# Patient Record
Sex: Male | Born: 1980 | Race: White | Hispanic: No | Marital: Single | Smoking: Never smoker
Health system: Southern US, Community
[De-identification: ages and names within clinical notes are randomized; demographics above are authoritative.]

## PROBLEM LIST (undated history)

## (undated) DIAGNOSIS — F32A Depression, unspecified: Secondary | ICD-10-CM

## (undated) DIAGNOSIS — F419 Anxiety disorder, unspecified: Secondary | ICD-10-CM

## (undated) DIAGNOSIS — F329 Major depressive disorder, single episode, unspecified: Secondary | ICD-10-CM

---

## 2013-06-06 ENCOUNTER — Emergency Department (HOSPITAL_COMMUNITY)
Admission: EM | Admit: 2013-06-06 | Discharge: 2013-06-06 | Disposition: A | Discharge status: Home / Self Care | Payer: Self-pay | Attending: Emergency Medicine | Admitting: Emergency Medicine

## 2013-06-06 ENCOUNTER — Encounter (HOSPITAL_COMMUNITY): Payer: Self-pay | Admitting: Emergency Medicine

## 2013-06-06 DIAGNOSIS — R110 Nausea: Secondary | ICD-10-CM | POA: Insufficient documentation

## 2013-06-06 DIAGNOSIS — F329 Major depressive disorder, single episode, unspecified: Secondary | ICD-10-CM

## 2013-06-06 DIAGNOSIS — F3289 Other specified depressive episodes: Secondary | ICD-10-CM | POA: Insufficient documentation

## 2013-06-06 DIAGNOSIS — M79609 Pain in unspecified limb: Secondary | ICD-10-CM | POA: Insufficient documentation

## 2013-06-06 DIAGNOSIS — F411 Generalized anxiety disorder: Secondary | ICD-10-CM

## 2013-06-06 DIAGNOSIS — Z79899 Other long term (current) drug therapy: Secondary | ICD-10-CM | POA: Insufficient documentation

## 2013-06-06 LAB — CBC WITH DIFFERENTIAL/PLATELET
Basophils Absolute: 0 10*3/uL (ref 0.0–0.1)
Basophils Relative: 0 % (ref 0–1)
MCHC: 35.1 g/dL (ref 30.0–36.0)
Monocytes Absolute: 0.5 10*3/uL (ref 0.1–1.0)
Neutro Abs: 5.7 10*3/uL (ref 1.7–7.7)
Neutrophils Relative %: 74 % (ref 43–77)
Platelets: 216 10*3/uL (ref 150–400)
RDW: 12.8 % (ref 11.5–15.5)
WBC: 7.7 10*3/uL (ref 4.0–10.5)

## 2013-06-06 LAB — COMPREHENSIVE METABOLIC PANEL
ALT: 15 U/L (ref 0–53)
AST: 13 U/L (ref 0–37)
Albumin: 4.5 g/dL (ref 3.5–5.2)
Chloride: 102 mEq/L (ref 96–112)
Creatinine, Ser: 0.92 mg/dL (ref 0.50–1.35)
Potassium: 3.9 mEq/L (ref 3.5–5.1)
Sodium: 139 mEq/L (ref 135–145)
Total Bilirubin: 0.5 mg/dL (ref 0.3–1.2)

## 2013-06-06 LAB — RAPID URINE DRUG SCREEN, HOSP PERFORMED
Amphetamines: NOT DETECTED
Opiates: NOT DETECTED
Tetrahydrocannabinol: NOT DETECTED

## 2013-06-06 MED ORDER — HYDROXYZINE HCL 25 MG PO TABS: 25.0000 mg | ORAL_TABLET | Freq: Three times a day (TID) | ORAL | Status: DC | PRN

## 2013-06-06 NOTE — ED Notes (Signed)
Call has been made to [facility] for consult. 

## 2013-06-06 NOTE — Consult Note (Signed)
Telepsych Consultation   Reason for Consult:  Anxiety Referring Physician:  [name] is an 32 y.o. male.  Assessment: AXIS I:  Depressive Disorder NOS, Generalized Anxiety Disorder and R/O Asperger Disorder AXIS II:  Deferred AXIS III:  History reviewed. No pertinent past medical history. AXIS IV:  problems related to social environment AXIS V:  51-60 moderate symptoms  Plan:  No evidence of imminent risk to self or others at present.   Patient does not meet criteria for psychiatric inpatient admission. Discussed crisis plan, support from social network, calling 911, coming to the Emergency Department, and calling Suicide Hotline.  Subjective: I am really anxious  HPI:   Andrew Howe is a 32 y.o. male patient to the Emergency Department complaining of anxiety. 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 He states that he was seen by his PCP last week and was started on Lexapro 10 MG for a week, then 20 MG daily. Patient has been taking for the past 4 to 5 days.  He reports having intermittent episodes of Anxiety and has been treated with Paxil, Wellbutrin and Zoloft in the past. He was last on Wellbutrin before being switched to Lexapro as his anxiety had gotten worse. PCP wanted the pt to be evaluated today to set him up for pschyiatric help.  He admits to SI occasionally but denies SI or specific plan. Last time he had SI was 4 to 5 days ago. He states that he lives with his parents and has access to a gun but Mom denies this. She states that there are no guns at home. He denies HI. He admits that he has been eating, drinking and sleeping less lately due to increased anxiety and depression over thinking about other people's lives and how "they might be better than mine".  He denies alcohol or illegal drug use. He also states he struggles socially, does not like changes and feels he has Asperger  Disorder. Mom agrees with patient and feels he needs an assessment for Autism.   Past Psychiatric History: History reviewed. No pertinent past medical history.  has no tobacco, alcohol, and drug history on file. History reviewed. No pertinent family history.       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 rate 14, weight 219 lb (99.338 kg), SpO2 100.00%.There is no height on file to calculate BMI. Results for orders placed during the hospital encounter of 06/06/13 (from the past 72 hour(s))  CBC WITH DIFFERENTIAL     Status: None   Collection Time    06/06/13  9:38 AM      Result Value Range   WBC 7.7  4.0 -  10.5 K/uL   RBC 5.37  4.22 - 5.81 MIL/uL   Hemoglobin 15.6  13.0 - 17.0 g/dL   HCT 16.1  09.6 - 04.5 %   MCV 82.7  78.0 - 100.0 fL   MCH 29.1  26.0 - 34.0 pg   MCHC 35.1  30.0 - 36.0 g/dL   RDW 40.9  81.1 - 91.4 %   Platelets 216  150 - 400 K/uL   Neutrophils Relative % 74  43 - 77 %   Neutro Abs 5.7  1.7 - 7.7 K/uL   Lymphocytes Relative 19  12 - 46 %   Lymphs Abs 1.5  0.7 - 4.0 K/uL   Monocytes Relative 6  3 - 12 %   Monocytes Absolute 0.5  0.1 - 1.0 K/uL   Eosinophils Relative 1  0 - 5 %   Eosinophils Absolute 0.1  0.0 - 0.7 K/uL   Basophils  Relative 0  0 - 1 %   Basophils Absolute 0.0  0.0 - 0.1 K/uL  COMPREHENSIVE METABOLIC PANEL     Status: Abnormal   Collection Time    06/06/13  9:38 AM      Result Value Range   Sodium 139  135 - 145 mEq/L   Potassium 3.9  3.5 - 5.1 mEq/L   Chloride 102  96 - 112 mEq/L   CO2 26  19 - 32 mEq/L   Glucose, Bld 112 (*) 70 - 99 mg/dL   BUN 14  6 - 23 mg/dL   Creatinine, Ser 7.82  0.50 - 1.35 mg/dL   Calcium 9.4  8.4 - 95.6 mg/dL   Total Protein 7.9  6.0 - 8.3 g/dL   Albumin 4.5  3.5 - 5.2 g/dL   AST 13  0 - 37 U/L   ALT 15  0 - 53 U/L   Alkaline Phosphatase 56  39 - 117 U/L   Total Bilirubin 0.5  0.3 - 1.2 mg/dL   GFR calc non Af Amer >90  >90 mL/min   GFR calc Af Amer >90  >90 mL/min   Comment:            The eGFR has been calculated     using the CKD EPI equation.     This calculation has not been     validated in all clinical     situations.     eGFR's persistently     <90 mL/min signify     possible Chronic Kidney Disease.  ETHANOL     Status: None   Collection Time    06/06/13  9:38 AM      Result Value Range   Alcohol, Ethyl (B) <11  0 - 11 mg/dL   Comment:            LOWEST DETECTABLE LIMIT FOR     SERUM ALCOHOL IS 11 mg/dL     FOR MEDICAL PURPOSES ONLY  URINE RAPID DRUG SCREEN (HOSP PERFORMED)     Status: None   Collection Time    06/06/13  9:56 AM      Result Value Range   Opiates NONE DETECTED  NONE DETECTED   Cocaine NONE DETECTED  NONE DETECTED   Benzodiazepines NONE DETECTED  NONE DETECTED   Amphetamines NONE DETECTED  NONE DETECTED   Tetrahydrocannabinol NONE DETECTED  NONE DETECTED   Barbiturates NONE DETECTED  NONE DETECTED   Comment:            DRUG SCREEN FOR MEDICAL PURPOSES  ONLY.  IF CONFIRMATION IS NEEDED     FOR ANY PURPOSE, NOTIFY LAB     WITHIN 5 DAYS.                LOWEST DETECTABLE LIMITS     FOR URINE DRUG SCREEN     Drug Class       Cutoff (ng/mL)     Amphetamine      1000     Barbiturate      200     Benzodiazepine   200      Tricyclics       300     Opiates          300     Cocaine          300     THC              50   Labs are reviewed and are negative  No current facility-administered medications for this encounter.   Current Outpatient Prescriptions  Medication Sig Dispense Refill  . escitalopram (LEXAPRO) 20 MG tablet Take 20 mg by mouth daily.      . naproxen sodium (ALEVE) 220 MG tablet Take 220 mg by mouth 2 (two) times daily as needed (Pain).        Psychiatric Specialty Exam:     Blood pressure 164/100, pulse 89, temperature 98.1 F (36.7 C), temperature source Oral, resp. rate 14, weight 219 lb (99.338 kg), SpO2 100.00%.There is no height on file to calculate BMI.  General Appearance: Casual and Fairly Groomed  Patent attorney::  Fair  Speech:  Clear and Coherent and Normal Rate  Volume:  Normal  Mood:  Anxious  Affect:  Congruent and Depressed  Thought Process:  Goal Directed and Intact  Orientation:  Full (Time, Place, and Person)  Thought Content:  WDL and Rumination  Suicidal Thoughts:  No  Homicidal Thoughts:  No  Memory:  Immediate;   Fair Recent;   Fair Remote;   Fair  Judgement:  Intact  Insight:  Shallow  Psychomotor Activity:  Normal  Concentration:  Fair  Recall:  Fair  Akathisia:  No  Handed:  Right  AIMS (if indicated):     Assets:  Housing Physical Health Social Support  Sleep:       Disposition:Patient discharged home in the care of mother. Patient to make an appointment with [facility] Increase Lexapro to 20 MG daily from today to help with anxiety Start Vistaril 25 MG PO 1 TID PRN anxiety. Discussed this with patient and Mom Discussed with patient a safety plan if patient has suicidal thoughts again, none currently, last time was 4 to 5 days ago. Mom also involved in the discussion of this plan and states she can monitor patient closely, also denies any safety concerns.     Murline Weigel 06/06/2013 2:30 PM

## 2013-06-06 NOTE — ED Notes (Signed)
PER EDP pt states he does have thoughts of suicide.

## 2013-06-06 NOTE — ED Provider Notes (Addendum)
CSN: [id number]     Arrival date & time 06/06/13  0912 History  This chart was scribed for Glynn Octave, MD by Bennett Scrape, ED Scribe. This patient was seen in room APA15/APA15 and the patient's care was started at 9:27 AM.   First MD Initiated Contact with Patient 06/06/13 0919     Chief Complaint  Patient presents with  . Anxiety    The history is provided by the patient. No language interpreter was used.    HPI Comments: Andrew Howe is a 32 y.o. male who presents to the Emergency Department complaining of anxiety. 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 He states that he was seen by his PCP last week and was started on Lexapro which he has been taking for the past 4 to 5 days. He reports having intermittent episodes of Anxiety and has been treated with Paxil, Wellbutrin and Zoloft in the past. He was last on Wellbutrin before being switched to Lexapro. PCP wanted the pt to be evaluated today to set him up for pschyiatric help. He admits to SI occasionally but denies SI or specific plan. Last time he had SI was 2 to 3 days ago. He states that he lives with his parents and has access to a gun. He denies HI. He admits that he has been eating, drinking and sleeping less lately due to increased anxiety and depression over thinking about other people's lives and how "they might be better than mine". He reports chronic nausea,"hot flashes" and bilateral arm pain described as tightness. He denies alcohol or illegal drug use.   [facility] is PCP  History reviewed. No pertinent past medical history. History reviewed. No pertinent past surgical history. History reviewed. No pertinent family history. History  Substance Use Topics  . Smoking status: Not on file  . Smokeless tobacco: Not on file  . Alcohol Use: Not on file    Review of Systems  A complete 10 system review of systems was obtained and all  systems are negative except as noted in the HPI and PMH.   Allergies  Review of patient's allergies indicates no known allergies.  Home Medications   Current Outpatient Rx  Name  Route  Sig  Dispense  Refill  . escitalopram (LEXAPRO) 20 MG tablet   Oral   Take 20 mg by mouth daily.         . naproxen sodium (ALEVE) 220 MG tablet   Oral   Take 220 mg by mouth 2 (two) times daily as needed (Pain).         . hydrOXYzine (ATARAX/VISTARIL) 25 MG tablet   Oral   Take 1 tablet (25 mg total) by mouth every 8 (eight) hours as needed for anxiety.   12 tablet   0     Triage Vitals: BP 167/109  Pulse 100  Temp(Src) 98.1 F (36.7 C) (Oral)  Resp 20  Wt 219 lb (99.338 kg)  SpO2 100%  Physical Exam  Nursing note and vitals reviewed. Constitutional: He is oriented to person, place, and time. He appears well-developed and well-nourished. No distress.  HENT:  Head: Normocephalic and atraumatic.  Eyes: Conjunctivae and EOM are normal.  Neck: Normal range of motion. Neck supple. No tracheal deviation present.  Cardiovascular: Normal rate, regular rhythm and normal heart sounds.   No murmur heard. Pulmonary/Chest: Effort normal and breath sounds normal. No respiratory distress. He has  no wheezes. He has no rales.  Abdominal: Soft. Bowel sounds are normal. There is no tenderness.  Musculoskeletal: Normal range of motion. He exhibits no edema.  Neurological: He is alert and oriented to person, place, and time. No cranial nerve deficit.  Skin: Skin is warm and dry.  Psychiatric:  Flat affect, poor eye contact    ED Course   Procedures (including critical care time)  DIAGNOSTIC STUDIES: Oxygen Saturation is 100% on room air, normal by my interpretation.    COORDINATION OF CARE: 9:36 AM-Discussed treatment plan which includes CBC panel, CMP, UA and telepsych with pt at bedside and pt agreed to plan.   Labs Reviewed  COMPREHENSIVE METABOLIC PANEL - Abnormal; Notable for the  following:    Glucose, Bld 112 (*)    All other components within normal limits  CBC WITH DIFFERENTIAL  ETHANOL  URINE RAPID DRUG SCREEN (HOSP PERFORMED)   No results found. 1. Depression     MDM  Anxiety, depression, vague thoughts of suicide. Access to guns at home. Denies any current suicidal ideation or plan.  Screening labs, psychiatric consult  Labs unremarkable. Given his vague SI with flat affect, poor eye contact, ongoing depression anxiety, we'll have psychiatry evaluate. Psychiatry consult complete.  Patient contracts for safety. Lexapro increased, vistaril prn. Denies SI or HI.  I personally performed the services described in this documentation, which was scribed in my presence. The recorded information has been reviewed and is accurate.    Glynn Octave, MD 06/06/13 1302  Glynn Octave, MD 06/06/13 1500

## 2013-06-06 NOTE — ED Notes (Signed)
States that he just quit his job, he thinks he has anxiety disorder.  I have been on medication for the past 4-5 days.  States that his PCP mentioned that he may have Asperger's.  States that he feels like he is having a panic attack right now.  No acute distress noted.

## 2013-06-06 NOTE — ED Notes (Signed)
Appt is 2:00 for consult.

## 2014-12-02 ENCOUNTER — Emergency Department (HOSPITAL_COMMUNITY): Payer: Self-pay

## 2014-12-02 ENCOUNTER — Encounter (HOSPITAL_COMMUNITY): Payer: Self-pay | Admitting: *Deleted

## 2014-12-02 ENCOUNTER — Emergency Department (HOSPITAL_COMMUNITY)
Admission: EM | Admit: 2014-12-02 | Discharge: 2014-12-02 | Disposition: A | Discharge status: Home / Self Care | Payer: Self-pay | Attending: Emergency Medicine | Admitting: Emergency Medicine

## 2014-12-02 DIAGNOSIS — M62830 Muscle spasm of back: Secondary | ICD-10-CM | POA: Insufficient documentation

## 2014-12-02 DIAGNOSIS — F329 Major depressive disorder, single episode, unspecified: Secondary | ICD-10-CM | POA: Insufficient documentation

## 2014-12-02 DIAGNOSIS — F419 Anxiety disorder, unspecified: Secondary | ICD-10-CM | POA: Insufficient documentation

## 2014-12-02 DIAGNOSIS — M545 Low back pain, unspecified: Secondary | ICD-10-CM

## 2014-12-02 DIAGNOSIS — I10 Essential (primary) hypertension: Secondary | ICD-10-CM

## 2014-12-02 DIAGNOSIS — M5134 Other intervertebral disc degeneration, thoracic region: Secondary | ICD-10-CM

## 2014-12-02 DIAGNOSIS — Z79899 Other long term (current) drug therapy: Secondary | ICD-10-CM | POA: Insufficient documentation

## 2014-12-02 HISTORY — DX: Anxiety disorder, unspecified: F41.9

## 2014-12-02 HISTORY — DX: Depression, unspecified: F32.A

## 2014-12-02 HISTORY — DX: Major depressive disorder, single episode, unspecified: F32.9

## 2014-12-02 HISTORY — DX: Essential (primary) hypertension: I10

## 2014-12-02 MED ORDER — KETOROLAC TROMETHAMINE 10 MG PO TABS
10.0000 mg | ORAL_TABLET | Freq: Once | ORAL | Status: AC
  Administered 2014-12-02: 10 mg via ORAL
  Filled 2014-12-02: qty 1

## 2014-12-02 MED ORDER — HYDROCODONE-ACETAMINOPHEN 5-325 MG PO TABS: 1.0000 | ORAL_TABLET | ORAL | Status: DC | PRN

## 2014-12-02 MED ORDER — DIAZEPAM 5 MG PO TABS
10.0000 mg | ORAL_TABLET | Freq: Once | ORAL | Status: AC
  Administered 2014-12-02: 10 mg via ORAL
  Filled 2014-12-02: qty 2

## 2014-12-02 MED ORDER — DICLOFENAC SODIUM 75 MG PO TBEC: 75.0000 mg | DELAYED_RELEASE_TABLET | Freq: Two times a day (BID) | ORAL | Status: DC

## 2014-12-02 MED ORDER — HYDROCODONE-ACETAMINOPHEN 5-325 MG PO TABS
2.0000 | ORAL_TABLET | Freq: Once | ORAL | Status: AC
  Administered 2014-12-02: 2 via ORAL
  Filled 2014-12-02: qty 2

## 2014-12-02 MED ORDER — BACLOFEN 10 MG PO TABS: 10.0000 mg | ORAL_TABLET | Freq: Three times a day (TID) | ORAL | Status: AC

## 2014-12-02 NOTE — ED Notes (Signed)
Pt c/o mid to left back pain. Pt states he felt a pop tonight while coughing.

## 2014-12-02 NOTE — ED Notes (Signed)
Pt verbalized understanding of no driving and to use caution within 4 hours of taking pain meds due to meds cause drowsiness 

## 2014-12-02 NOTE — ED Provider Notes (Signed)
CSN: [id number]     Arrival date & time 12/02/14  2004 History   First MD Initiated Contact with Patient 12/02/14 2105     Chief Complaint  Patient presents with  . Back Pain     (Consider location/radiation/quality/duration/timing/severity/associated sxs/prior Treatment) Patient is a 34 y.o. male presenting with back pain. The history is provided by the patient.  Back Pain Location:  Thoracic spine and lumbar spine Quality:  Aching and shooting Pain severity:  Moderate Pain is:  Same all the time Duration:  3 hours Timing:  Intermittent Progression:  Worsening Chronicity:  Recurrent Context comment:  Cough Relieved by:  Nothing Worsened by:  Movement, twisting and bending Ineffective treatments:  Lying down and being still Associated symptoms: no abdominal pain, no bladder incontinence, no bowel incontinence, no chest pain, no dysuria and no perianal numbness   Risk factors: no recent surgery and no vascular disease     Past Medical History  Diagnosis Date  . Anxiety   . Depression    History reviewed. No pertinent past surgical history. History reviewed. No pertinent family history. History  Substance Use Topics  . Smoking status: Never Smoker   . Smokeless tobacco: Not on file  . Alcohol Use: No    Review of Systems  Constitutional: Negative for activity change.       All ROS Neg except as noted in HPI  HENT: Negative for nosebleeds.   Eyes: Negative for photophobia and discharge.  Respiratory: Negative for cough, shortness of breath and wheezing.   Cardiovascular: Negative for chest pain and palpitations.  Gastrointestinal: Negative for abdominal pain, blood in stool and bowel incontinence.  Genitourinary: Negative for bladder incontinence, dysuria, frequency and hematuria.  Musculoskeletal: Positive for back pain. Negative for arthralgias and neck pain.  Skin: Negative.   Neurological: Negative for dizziness, seizures and speech difficulty.   Psychiatric/Behavioral: Negative for hallucinations and confusion. The patient is nervous/anxious.        Depression      Allergies  Review of patient's allergies indicates no known allergies.  Home Medications   Prior to Admission medications   Medication Sig Start Date End Date Taking? Authorizing Provider  escitalopram (LEXAPRO) 20 MG tablet Take 20 mg by mouth daily.   Yes Historical Provider, MD  guaifenesin (ROBITUSSIN) 100 MG/5ML syrup Take 200 mg by mouth 3 (three) times daily as needed for cough.   Yes Historical Provider, MD  hydrOXYzine (ATARAX/VISTARIL) 25 MG tablet Take 1 tablet (25 mg total) by mouth every 8 (eight) hours as needed for anxiety. Patient not taking: Reported on 12/02/2014 06/06/13   Glynn OctaveStephen Rancour, MD   BP 146/97 mmHg  Pulse 94  Temp(Src) 98.1 F (36.7 C)  Resp 20  Ht 5\' 11"  (1.803 m)  Wt 230 lb (104.327 kg)  BMI 32.09 kg/m2  SpO2 99% Physical Exam  Constitutional: He is oriented to person, place, and time. He appears well-developed and well-nourished.  Non-toxic appearance.  HENT:  Head: Normocephalic.  Right Ear: Tympanic membrane and external ear normal.  Left Ear: Tympanic membrane and external ear normal.  Eyes: EOM and lids are normal. Pupils are equal, round, and reactive to light.  Neck: Normal range of motion. Neck supple. Carotid bruit is not present.  Cardiovascular: Normal rate, regular rhythm, normal heart sounds, intact distal pulses and normal pulses.   Pulmonary/Chest: Breath sounds normal. No respiratory distress.  Abdominal: Soft. Bowel sounds are normal. There is no tenderness. There is no guarding.  Musculoskeletal:  Lumbar back: He exhibits decreased range of motion, tenderness, pain and spasm.  Pain with left leg raise. No transfer of pain to the  Knee, ankle or foot  Lymphadenopathy:       Head (right side): No submandibular adenopathy present.       Head (left side): No submandibular adenopathy present.    He has no  cervical adenopathy.  Neurological: He is alert and oriented to person, place, and time. He has normal strength. No cranial nerve deficit or sensory deficit.  Skin: Skin is warm and dry.  Psychiatric: He has a normal mood and affect. His speech is normal.  Nursing note and vitals reviewed.   ED Course  Procedures (including critical care time) Labs Review Labs Reviewed - No data to display  Imaging Review Dg Lumbar Spine Complete  12/02/2014   CLINICAL DATA:  Right-sided lower back pain, exacerbated by shoveling snow  EXAM: LUMBAR SPINE - COMPLETE 4+ VIEW  COMPARISON:  None.  FINDINGS: Frontal, lateral, spot lumbosacral lateral, and bilateral oblique views were obtained. There are 5 non-rib-bearing lumbar type vertebral bodies. There is lumbar levoscoliosis. There is slight anterior wedging of the T12 vertebral body, likely chronic. There is no other evidence of fracture. No spondylolisthesis. There is moderate disc space narrowing at T11-12 and T12-L1. Other disc spaces appear intact. There is no appreciable facet arthropathy.  IMPRESSION: Scoliosis. Slight anterior wedging at T12, chronic in appearance. No other fracture. No spondylolisthesis. No appreciable arthropathic change in the lumbar region. There is, however, disc space narrowing in the lower thoracic spine.   Electronically Signed   By: Bretta Bang M.D.   On: 12/02/2014 21:53     EKG Interpretation None      MDM   No gross neuro deficits. L spine xray is c/w scoliosis. There is disc space narrowing at T11-t12, and T12-L1. Plan: Rx for baclofen, diclofenac, and norco. referal to Dr Juanetta Gosling.     Final diagnoses:  DDD (degenerative disc disease), thoracic  Left-sided low back pain without sciatica    *I have reviewed nursing notes, vital signs, and all appropriate lab and imaging results for this patient.[address] Garry Heater, PA-C 12/05/14 1729  Glynn Octave, MD 12/05/14 2124

## 2014-12-02 NOTE — Discharge Instructions (Signed)
Your x-ray is negative for fracture or dislocation. There is noted narrowing at the T11-T12, T12-L1 areas. This is probably consistent with degenerative disc disease. Please discuss this with Dr. Juanetta GoslingHawkins for possible MRI, or orthopedic referral, as well as pain management. Degenerative Disk Disease Degenerative disk disease is a condition caused by the changes that occur in the cushions of the backbone (spinal disks) as you grow older. Spinal disks are soft and compressible disks located between the bones of the spine (vertebrae). They act like shock absorbers. Degenerative disk disease can affect the whole spine. However, the neck and lower back are most commonly affected. Many changes can occur in the spinal disks with aging, such as:  The spinal disks may dry and shrink.  Small tears may occur in the tough, outer covering of the disk (annulus).  The disk space may become smaller due to loss of water.  Abnormal growths in the bone (spurs) may occur. This can put pressure on the nerve roots exiting the spinal canal, causing pain.  The spinal canal may become narrowed. CAUSES  Degenerative disk disease is a condition caused by the changes that occur in the spinal disks with aging. The exact cause is not known, but there is a genetic basis for many patients. Degenerative changes can occur due to loss of fluid in the disk. This makes the disk thinner and reduces the space between the backbones. Small cracks can develop in the outer layer of the disk. This can lead to the breakdown of the disk. You are more likely to get degenerative disk disease if you are overweight. Smoking cigarettes and doing heavy work such as weightlifting can also increase your risk of this condition. Degenerative changes can start after a sudden injury. Growth of bone spurs can compress the nerve roots and cause pain.  SYMPTOMS  The symptoms vary from person to person. Some people may have no pain, while others have severe  pain. The pain may be so severe that it can limit your activities. The location of the pain depends on the part of your backbone that is affected. You will have neck or arm pain if a disk in the neck area is affected. You will have pain in your back, buttocks, or legs if a disk in the lower back is affected. The pain becomes worse while bending, reaching up, or with twisting movements. The pain may start gradually and then get worse as time passes. It may also start after a major or minor injury. You may feel numbness or tingling in the arms or legs.  DIAGNOSIS  Your caregiver will ask you about your symptoms and about activities or habits that may cause the pain. He or she may also ask about any injuries, diseases, or treatments you have had earlier. Your caregiver will examine you to check for the range of movement that is possible in the affected area, to check for strength in your extremities, and to check for sensation in the areas of the arms and legs supplied by different nerve roots. An X-ray of the spine may be taken. Your caregiver may suggest other imaging tests, such as magnetic resonance imaging (MRI), if needed.  TREATMENT  Treatment includes rest, modifying your activities, and applying ice and heat. Your caregiver may prescribe medicines to reduce your pain and may ask you to do some exercises to strengthen your back. In some cases, you may need surgery. You and your caregiver will decide on the treatment that is best for  you. HOME CARE INSTRUCTIONS   Follow proper lifting and walking techniques as advised by your caregiver.  Maintain good posture.  Exercise regularly as advised.  Perform relaxation exercises.  Change your sitting, standing, and sleeping habits as advised. Change positions frequently.  Lose weight as advised.  Stop smoking if you smoke.  Wear supportive footwear. SEEK MEDICAL CARE IF:  Your pain does not go away within 1 to 4 weeks. SEEK IMMEDIATE MEDICAL CARE  IF:   Your pain is severe.  You notice weakness in your arms, hands, or legs.  You begin to lose control of your bladder or bowel movements. MAKE SURE YOU:   Understand these instructions.  Will watch your condition.  Will get help right away if you are not doing well or get worse. Document Released: 08/17/2007 Document Revised: 01/12/2012 Document Reviewed: 02/21/2014 [facility] Patient Information 2015 [place], [place]. This information is not intended to replace advice given to you by your health care provider. Make sure you discuss any questions you have with your health care provider.

## 2014-12-02 NOTE — ED Notes (Signed)
Reported by triage nurse that pt insisted on finishing his drink after instructed not to

## 2018-02-26 ENCOUNTER — Emergency Department (HOSPITAL_COMMUNITY): Payer: Self-pay

## 2018-02-26 ENCOUNTER — Emergency Department (HOSPITAL_COMMUNITY)
Admission: EM | Admit: 2018-02-26 | Discharge: 2018-02-26 | Disposition: A | Discharge status: Home / Self Care | Payer: Self-pay | Attending: Emergency Medicine | Admitting: Emergency Medicine

## 2018-02-26 ENCOUNTER — Encounter (HOSPITAL_COMMUNITY): Payer: Self-pay | Admitting: Emergency Medicine

## 2018-02-26 DIAGNOSIS — Z79899 Other long term (current) drug therapy: Secondary | ICD-10-CM | POA: Insufficient documentation

## 2018-02-26 DIAGNOSIS — I10 Essential (primary) hypertension: Secondary | ICD-10-CM | POA: Insufficient documentation

## 2018-02-26 DIAGNOSIS — R0789 Other chest pain: Secondary | ICD-10-CM | POA: Insufficient documentation

## 2018-02-26 LAB — BASIC METABOLIC PANEL
ANION GAP: 12 (ref 5–15)
BUN: 14 mg/dL (ref 6–20)
CO2: 26 mmol/L (ref 22–32)
Calcium: 9.7 mg/dL (ref 8.9–10.3)
Chloride: 100 mmol/L — ABNORMAL LOW (ref 101–111)
Creatinine, Ser: 1 mg/dL (ref 0.61–1.24)
GFR calc Af Amer: >60 mL/min (ref >60)
Glucose, Bld: 100 mg/dL — ABNORMAL HIGH (ref 65–99)
POTASSIUM: 4.1 mmol/L (ref 3.5–5.1)
SODIUM: 138 mmol/L (ref 135–145)

## 2018-02-26 LAB — CBC
HEMATOCRIT: 45.2 % (ref 39.0–52.0)
Hemoglobin: 15.3 g/dL (ref 13.0–17.0)
MCH: 28.5 pg (ref 26.0–34.0)
MCHC: 33.8 g/dL (ref 30.0–36.0)
MCV: 84.2 fL (ref 78.0–100.0)
Platelets: 232 10*3/uL (ref 150–400)
RBC: 5.37 MIL/uL (ref 4.22–5.81)
RDW: 13.1 % (ref 11.5–15.5)
WBC: 7.8 10*3/uL (ref 4.0–10.5)

## 2018-02-26 LAB — TROPONIN I: Troponin I: <0.03 ng/mL (ref <0.03)

## 2018-02-26 LAB — D-DIMER, QUANTITATIVE: D-Dimer, Quant: <0.27 ug/mL-FEU (ref 0.00–0.50)

## 2018-02-26 NOTE — Discharge Instructions (Addendum)
Your blood pressure is slightly elevated at 142/95.  Please discuss this with Dr. Juanetta GoslingHawkins.  Your cardiac enzymes have been negative.  Your electrocardiogram is negative for acute changes.  Your chest x-ray is also negative for acute changes or problems.  Your d-dimer test is designed to detect problems with blood clots, and yours is negative.  I suspect that your pain is chest wall related pain.  Please use Tylenol every 4 hours, and/or ibuprofen every 6 hours for soreness.  Please see Dr. Juanetta GoslingHawkins, or return to the emergency department if any changes in your condition, problems, or concerns.

## 2018-02-26 NOTE — ED Triage Notes (Signed)
Patient complains of chest pain with dizzness and lightheadedness on Sunday. States hypertension. Was seen by Dr. Lonzo CandyHawkings this morning and sent to ED. States EKG was done and was normal.

## 2018-02-26 NOTE — ED Notes (Signed)
CHEST PAIN UPPER MID STERNAL SINCE LAST Monday NO CONTACT WITH PCP UNTIL TODAY  CHEST WALL TENDER TO PALPATION   LS CLEAR

## 2018-02-26 NOTE — ED Provider Notes (Signed)
[facility] EMERGENCY DEPARTMENT Provider Note   CSN: [id number] Arrival date & time: 02/26/18  1033     History   Chief Complaint Chief Complaint  Patient presents with  . Chest Pain    HPI Andrew Howe is a 37 y.o. male.  Patient is a 37 year old male who presents to the emergency department with a complaint of chest pain with dizziness and lightheadedness.  The patient states this problem started on Sunday, April 21.  He states it was accompanied by sensation of everything spinning, and also lightheadedness.  He noted some tingling of his lips and also some tingling of his fingertips.  The patient states this problem is been waxing and waning since that time.  He was seen by his primary physician Dr.Hawkins earlier this morning, and they sent him to the emergency department for evaluation.  The patient denies any loss of consciousness.  Is been no recent injury to his head or chest.  No fever or chills.       Past Medical History:  Diagnosis Date  . Anxiety   . Depression     There are no active problems to display for this patient.   History reviewed. No pertinent surgical history.      Home Medications    Prior to Admission medications   Medication Sig Start Date End Date Taking? Authorizing Provider  diclofenac (VOLTAREN) 75 MG EC tablet Take 1 tablet (75 mg total) by mouth 2 (two) times daily. 12/02/14   Ivery Quale, PA-C  escitalopram (LEXAPRO) 20 MG tablet Take 20 mg by mouth daily.    [provider]  guaifenesin (ROBITUSSIN) 100 MG/5ML syrup Take 200 mg by mouth 3 (three) times daily as needed for cough.    [provider]  HYDROcodone-acetaminophen (NORCO/VICODIN) 5-325 MG per tablet Take 1 tablet by mouth every 4 (four) hours as needed. 12/02/14   Ivery Quale, PA-C  hydrOXYzine (ATARAX/VISTARIL) 25 MG tablet Take 1 tablet (25 mg total) by mouth every 8 (eight) hours as needed for anxiety. Patient not taking: Reported on  12/02/2014 06/06/13   Glynn Octave, MD    Family History No family history on file.  Social History Social History   Tobacco Use  . Smoking status: Never Smoker  . Smokeless tobacco: Never Used  Substance Use Topics  . Alcohol use: No  . Drug use: No     Allergies   Patient has no known allergies.   Review of Systems Review of Systems  Constitutional: Negative for activity change.       All ROS Neg except as noted in HPI  HENT: Negative for nosebleeds.   Eyes: Negative for photophobia and discharge.  Respiratory: Negative for cough, shortness of breath and wheezing.   Cardiovascular: Positive for chest pain. Negative for palpitations.  Gastrointestinal: Negative for abdominal pain and blood in stool.  Genitourinary: Negative for dysuria, frequency and hematuria.  Musculoskeletal: Negative for arthralgias, back pain and neck pain.  Skin: Negative.   Neurological: Positive for dizziness and light-headedness. Negative for seizures and speech difficulty.  Psychiatric/Behavioral: Negative for confusion and hallucinations.     Physical Exam Updated Vital Signs BP (!) 142/95   Pulse 82   Temp 98.9 F (37.2 C) (Oral)   Resp 20   Ht 6' (1.829 m)   Wt 104.3 kg (230 lb)   SpO2 99%   BMI 31.19 kg/m   Physical Exam  Constitutional: He is oriented to person, place, and time. He appears well-developed and  well-nourished.  Non-toxic appearance.  HENT:  Head: Normocephalic.  Right Ear: Tympanic membrane and external ear normal.  Left Ear: Tympanic membrane and external ear normal.  Eyes: Pupils are equal, round, and reactive to light. EOM and lids are normal.  Neck: Normal range of motion. Neck supple. Carotid bruit is not present.  Cardiovascular: Normal rate, regular rhythm, normal heart sounds, intact distal pulses and normal pulses.  Pulmonary/Chest: Breath sounds normal. No respiratory distress. He exhibits tenderness. He exhibits no deformity.    Abdominal: Soft.  Bowel sounds are normal. There is no tenderness. There is no guarding.  Musculoskeletal: Normal range of motion.  Lymphadenopathy:       Head (right side): No submandibular adenopathy present.       Head (left side): No submandibular adenopathy present.    He has no cervical adenopathy.  Neurological: He is alert and oriented to person, place, and time. He has normal strength. No cranial nerve deficit or sensory deficit.  Skin: Skin is warm and dry.  Psychiatric: He has a normal mood and affect. His speech is normal.  Nursing note and vitals reviewed.    ED Treatments / Results  Labs (all labs ordered are listed, but only abnormal results are displayed) Labs Reviewed  BASIC METABOLIC PANEL - Abnormal; Notable for the following components:      Result Value   Chloride 100 (*)    Glucose, Bld 100 (*)    All other components within normal limits  CBC  TROPONIN I  D-DIMER, QUANTITATIVE (NOT AT [facility])  TROPONIN I    EKG EKG Interpretation  Date/Time:  Friday February 26 2018 10:54:56 EDT Ventricular Rate:  89 PR Interval:  142 QRS Duration: 88 QT Interval:  350 QTC Calculation: 425 R Axis:   22 Text Interpretation:  Normal sinus rhythm Normal ECG No old tracing to compare Confirmed by Mancel Bale [phone]) on 02/26/2018 11:11:36 AM   Radiology Dg Chest 2 View  Result Date: 02/26/2018 CLINICAL DATA:  Chest pain and shortness of breath EXAM: CHEST - 2 VIEW COMPARISON:  None. FINDINGS: The heart size and mediastinal contours are within normal limits. Both lungs are clear. The visualized skeletal structures are unremarkable. IMPRESSION: No active cardiopulmonary disease. Electronically Signed   By: Alcide Clever M.D.   On: 02/26/2018 11:10    Procedures Procedures (including critical care time)  Medications Ordered in ED Medications - No data to display   Initial Impression / Assessment and Plan / ED Course  I have reviewed the triage vital signs and the nursing  notes.  Pertinent labs & imaging results that were available during my care of the patient were reviewed by me and considered in my medical decision making (see chart for details).  Clinical Course as of Feb 26 1525  Fri Feb 26, 2018  1323 CBC [TF]    Clinical Course User Index [TF] Sharee Holster      Final Clinical Impressions(s) / ED Diagnoses  MDM  Blood pressure is elevated throughout the emergency department visit.  Improved however at the time of discharge.  Electrocardiogram shows normal sinus rhythm.  There is no evidence of a STEMI.  There are no life-threatening arrhythmias appreciated.  Basic metabolic panel is nonacute.  Complete blood count is nonacute.  Initial troponin is negative for acute cardiac event.  D-dimer test is negative for acute event.  I reviewed the test results, and the electrocardiogram with the patient in terms of which she understands.  The patient states the chest pain seems to be getting some better.  I also discussed with him the need for a second troponin test.  Second troponin is negative for acute event.  Patient states he feels much better, and feels comfortable going home.  Repeat vital signs reviewed.  Advanced the patient to make an appointment with his primary physician to discuss these changes and findings.  I have also asked the patient to make a journal of his pain so that he may discuss this with his doctor.  Patient is in agreement with this plan.   Final diagnoses:  Chest wall pain  Essential hypertension    ED Discharge Orders    None       Ivery QualeBryant, Violeta Lecount, PA-C 02/26/18 1741    Mancel BaleWentz, Elliott, MD 02/27/18 [phone]

## 2018-02-26 NOTE — ED Notes (Signed)
Pt reports he is feeling better since being here

## 2020-02-20 ENCOUNTER — Ambulatory Visit: Payer: Self-pay | Attending: Internal Medicine

## 2020-02-20 ENCOUNTER — Other Ambulatory Visit: Payer: Self-pay

## 2020-02-20 DIAGNOSIS — Z20822 Contact with and (suspected) exposure to covid-19: Secondary | ICD-10-CM | POA: Insufficient documentation

## 2020-02-21 LAB — SARS-COV-2, NAA 2 DAY TAT

## 2020-02-21 LAB — NOVEL CORONAVIRUS, NAA: SARS-CoV-2, NAA: NOT DETECTED

## 2020-02-22 ENCOUNTER — Telehealth: Payer: Self-pay | Admitting: Pulmonary Disease

## 2020-02-22 NOTE — Telephone Encounter (Signed)
Negative COVID results given. Patient results "NOT Detected." Caller expressed understanding. ° °

## 2020-10-16 ENCOUNTER — Encounter: Payer: Self-pay | Admitting: Internal Medicine

## 2020-10-16 ENCOUNTER — Other Ambulatory Visit: Payer: Self-pay

## 2020-10-16 ENCOUNTER — Ambulatory Visit (INDEPENDENT_AMBULATORY_CARE_PROVIDER_SITE_OTHER): Payer: Self-pay | Admitting: Internal Medicine

## 2020-10-16 VITALS — BP 161/107 | HR 103 | Temp 99.2°F | Resp 18 | Ht 72.0 in | Wt 235.0 lb

## 2020-10-16 DIAGNOSIS — G250 Essential tremor: Secondary | ICD-10-CM

## 2020-10-16 DIAGNOSIS — I10 Essential (primary) hypertension: Secondary | ICD-10-CM

## 2020-10-16 DIAGNOSIS — Z7689 Persons encountering health services in other specified circumstances: Secondary | ICD-10-CM

## 2020-10-16 DIAGNOSIS — F419 Anxiety disorder, unspecified: Secondary | ICD-10-CM | POA: Insufficient documentation

## 2020-10-16 MED ORDER — LISINOPRIL 5 MG PO TABS: 5.0000 mg | ORAL_TABLET | Freq: Every day | ORAL | 1 refills | Status: DC

## 2020-10-16 MED ORDER — METOPROLOL SUCCINATE ER 50 MG PO TB24: 50.0000 mg | ORAL_TABLET | Freq: Every day | ORAL | 1 refills | Status: DC

## 2020-10-16 NOTE — Assessment & Plan Note (Signed)
Care established Previous chart reviewed History and medications reviewed with the patient 

## 2020-10-16 NOTE — Progress Notes (Signed)
New Patient Office Visit  Subjective:  Patient ID: Andrew Howe, male    DOB: 04/20/1981  Age: 39 y.o. MRN: [id number]  CC:  Chief Complaint  Patient presents with  . New Patient (Initial Visit)    New pt former dr Luan Pulling pt just establishing care    HPI Andrew Howe is a 39 year old male with PMH of HTN, anxiety and essential tremors who presents for establishing care.  Patient has not been taking his Metoprolol regularly as he was afraid he would run out of it. His BP was 184/116 initially, and was found to be 161/107 upon repeat check. He denies any headache, dizziness, chest pain, dyspnea or palpitations.  Patient also has h/o essential tremors and seems that is the reason he was placed on B-blocker for his HTN.  Patient also has a h/o anxiety, for which he used to take Lexapro and Atarax in the past. He feels that he is better without anxiety medications.  Patient mentions that he can not afford to follow up frequently. Patient is advised to get at least CMP and lipid profile done and will be seen again after 6 months.  Past Medical History:  Diagnosis Date  . Anxiety   . Depression   . Depression    Phreesia 10/15/2020  . Hypertension 12/02/2014   Phreesia 10/15/2020    History reviewed. No pertinent surgical history.  History reviewed. No pertinent family history.  Social History   Socioeconomic History  . Marital status: Single    Spouse name: Not on file  . Number of children: Not on file  . Years of education: Not on file  . Highest education level: Not on file  Occupational History    Employer: [employer]  Tobacco Use  . Smoking status: Never Smoker  . Smokeless tobacco: Never Used  Substance and Sexual Activity  . Alcohol use: No  . Drug use: No  . Sexual activity: Not on file  Other Topics Concern  . Not on file  Social History Narrative  . Not on file   Social Determinants of Health   Financial Resource Strain: Not on file  Food  Insecurity: Not on file  Transportation Needs: Not on file  Physical Activity: Not on file  Stress: Not on file  Social Connections: Not on file  Intimate Partner Violence: Not on file    ROS Review of Systems  Constitutional: Negative for chills and fever.  HENT: Negative for congestion and sore throat.   Eyes: Negative for pain and discharge.  Respiratory: Negative for cough and shortness of breath.   Cardiovascular: Negative for chest pain and palpitations.  Gastrointestinal: Negative for constipation, diarrhea, nausea and vomiting.  Endocrine: Negative for polydipsia and polyuria.  Genitourinary: Negative for dysuria and hematuria.  Musculoskeletal: Negative for neck pain and neck stiffness.  Skin: Negative for rash.  Neurological: Negative for dizziness, weakness, numbness and headaches.  Psychiatric/Behavioral: Negative for agitation, behavioral problems and sleep disturbance. The patient is nervous/anxious.     Objective:   Today's Vitals: BP (!) 161/107 (BP Location: Left Arm)   Pulse (!) 103   Temp 99.2 F (37.3 C) (Oral)   Resp 18   Ht 6' (1.829 m)   Wt 235 lb (106.6 kg)   SpO2 98%   BMI 31.87 kg/m   Physical Exam Vitals reviewed.  Constitutional:      General: He is not in acute distress.    Appearance: He is not diaphoretic.  HENT:  Head: Normocephalic and atraumatic.     Nose: Nose normal.     Mouth/Throat:     Mouth: Mucous membranes are moist.  Eyes:     General: No scleral icterus.    Extraocular Movements: Extraocular movements intact.     Pupils: Pupils are equal, round, and reactive to light.  Cardiovascular:     Rate and Rhythm: Regular rhythm. Tachycardia present.     Pulses: Normal pulses.     Heart sounds: Normal heart sounds. No murmur heard.   Pulmonary:     Breath sounds: Normal breath sounds. No wheezing or rales.  Abdominal:     Palpations: Abdomen is soft.     Tenderness: There is no abdominal tenderness.  Musculoskeletal:      Cervical back: Neck supple. No tenderness.     Right lower leg: No edema.     Left lower leg: No edema.  Skin:    General: Skin is warm.     Findings: No rash.  Neurological:     General: No focal deficit present.     Mental Status: He is alert and oriented to person, place, and time.     Sensory: No sensory deficit.     Motor: No weakness.  Psychiatric:        Mood and Affect: Mood normal.        Behavior: Behavior normal.      Assessment & Plan:   Problem List Items Addressed This Visit      Encounter to establish care - Primary   Care established Previous chart reviewed History and medications reviewed with the patient     Relevant Orders  CBC with Differential  CMP14+EGFR  HgB A1c  Lipid Profile  TSH + free T4    Cardiovascular and Mediastinum   Primary hypertension    BP Readings from Last 1 Encounters:  10/16/20 (!) 161/107   Uncontrolled, noncompliant Anxiety also a contributing factor, denies to take medication for anxiety On Metoprolol 50 mg QD, refilled Added Lisinopril, check CMP after 2 weeks Counseled for compliance with the medications Advised DASH diet and moderate exercise/walking, at least 150 mins/week       Relevant Medications   lisinopril (ZESTRIL) 5 MG tablet   metoprolol succinate (TOPROL-XL) 50 MG 24 hr tablet     Nervous and Auditory   Essential tremor    On B-blocker Advised to cut down caffeinated products        Other      Anxiety    Reports h/o anxiety Likely one of the contributing factor for HTN Denies to take any medications for it         Outpatient Encounter Medications as of 10/16/2020  Medication Sig  . [DISCONTINUED] metoprolol succinate (TOPROL-XL) 50 MG 24 hr tablet Take 50 mg by mouth daily. Take with or immediately following a meal.  . lisinopril (ZESTRIL) 5 MG tablet Take 1 tablet (5 mg total) by mouth daily.  . metoprolol succinate (TOPROL-XL) 50 MG 24 hr tablet Take 1 tablet (50 mg total) by  mouth daily. Take with or immediately following a meal.  . [DISCONTINUED] diclofenac (VOLTAREN) 75 MG EC tablet Take 1 tablet (75 mg total) by mouth 2 (two) times daily. (Patient not taking: Reported on 10/16/2020)  . [DISCONTINUED] escitalopram (LEXAPRO) 20 MG tablet Take 20 mg by mouth daily. (Patient not taking: Reported on 10/16/2020)  . [DISCONTINUED] guaifenesin (ROBITUSSIN) 100 MG/5ML syrup Take 200 mg by mouth 3 (three) times daily as needed  for cough. (Patient not taking: Reported on 10/16/2020)  . [DISCONTINUED] HYDROcodone-acetaminophen (NORCO/VICODIN) 5-325 MG per tablet Take 1 tablet by mouth every 4 (four) hours as needed. (Patient not taking: Reported on 10/16/2020)  . [DISCONTINUED] hydrOXYzine (ATARAX/VISTARIL) 25 MG tablet Take 1 tablet (25 mg total) by mouth every 8 (eight) hours as needed for anxiety. (Patient not taking: No sig reported)  . [DISCONTINUED] metoprolol succinate (TOPROL-XL) 100 MG 24 hr tablet TAKE (1) TABLET BY MOUTHSONCE DAILY.EU   No facility-administered encounter medications on file as of 10/16/2020.    Follow-up: Return in about 6 months (around 04/16/2021).   Lindell Spar, MD

## 2020-10-16 NOTE — Assessment & Plan Note (Signed)
Reports h/o anxiety Likely one of the contributing factor for HTN Denies to take any medications for it

## 2020-10-16 NOTE — Assessment & Plan Note (Signed)
BP Readings from Last 1 Encounters:  10/16/20 (!) 161/107   Uncontrolled, noncompliant Anxiety also a contributing factor, denies to take medication for anxiety On Metoprolol 50 mg QD, refilled Added Lisinopril, check CMP after 2 weeks Counseled for compliance with the medications Advised DASH diet and moderate exercise/walking, at least 150 mins/week

## 2020-10-16 NOTE — Patient Instructions (Signed)
Please follow DASH diet and perform moderate exercise/walking at least 150 mins/week.  Please get fasting blood tests done after 2 weeks.  DASH stands for Dietary Approaches to Stop Hypertension. The DASH diet is a healthy-eating plan designed to help treat or prevent high blood pressure (hypertension).  The DASH diet includes foods that are rich in potassium, calcium and magnesium. These nutrients help control blood pressure. The diet limits foods that are high in sodium, saturated fat and added sugars.  Studies have shown that the DASH diet can lower blood pressure in as little as two weeks. The diet can also lower low-density lipoprotein (LDL or "bad") cholesterol levels in the blood. High blood pressure and high LDL cholesterol levels are two major risk factors for heart disease and stroke.    DASH diet: Recommended servings The DASH diet provides daily and weekly nutritional goals. The number of servings you should have depends on your daily calorie needs.  Here's a look at the recommended servings from each food group for a 2,000-calorie-a-day DASH diet:  Grains: 6 to 8 servings a day. One serving is one slice bread, 1 ounce dry cereal, or 1/2 cup cooked cereal, rice or pasta. Vegetables: 4 to 5 servings a day. One serving is 1 cup raw leafy green vegetable, 1/2 cup cut-up raw or cooked vegetables, or 1/2 cup vegetable juice. Fruits: 4 to 5 servings a day. One serving is one medium fruit, 1/2 cup fresh, frozen or canned fruit, or 1/2 cup fruit juice. Fat-free or low-fat dairy products: 2 to 3 servings a day. One serving is 1 cup milk or yogurt, or 1 1/2 ounces cheese. Lean meats, poultry and fish: six 1-ounce servings or fewer a day. One serving is 1 ounce cooked meat, poultry or fish, or 1 egg. Nuts, seeds and legumes: 4 to 5 servings a week. One serving is 1/3 cup nuts, 2 tablespoons peanut butter, 2 tablespoons seeds, or 1/2 cup cooked legumes (dried beans or peas). Fats and oils: 2  to 3 servings a day. One serving is 1 teaspoon soft margarine, 1 teaspoon vegetable oil, 1 tablespoon mayonnaise or 2 tablespoons salad dressing. Sweets and added sugars: 5 servings or fewer a week. One serving is 1 tablespoon sugar, jelly or jam, 1/2 cup sorbet, or 1 cup lemonade.

## 2020-10-16 NOTE — Assessment & Plan Note (Signed)
On B-blocker Advised to cut down caffeinated products 

## 2021-04-16 ENCOUNTER — Ambulatory Visit: Payer: Self-pay | Admitting: Internal Medicine

## 2021-06-11 ENCOUNTER — Ambulatory Visit (INDEPENDENT_AMBULATORY_CARE_PROVIDER_SITE_OTHER): Payer: Self-pay | Admitting: Internal Medicine

## 2021-06-11 ENCOUNTER — Encounter: Payer: Self-pay | Admitting: Internal Medicine

## 2021-06-11 ENCOUNTER — Other Ambulatory Visit: Payer: Self-pay

## 2021-06-11 VITALS — BP 184/84 | HR 85 | Temp 98.3°F | Resp 18 | Ht 73.0 in | Wt 242.0 lb

## 2021-06-11 DIAGNOSIS — Z114 Encounter for screening for human immunodeficiency virus [HIV]: Secondary | ICD-10-CM

## 2021-06-11 DIAGNOSIS — I10 Essential (primary) hypertension: Secondary | ICD-10-CM

## 2021-06-11 DIAGNOSIS — Z1159 Encounter for screening for other viral diseases: Secondary | ICD-10-CM

## 2021-06-11 DIAGNOSIS — G250 Essential tremor: Secondary | ICD-10-CM

## 2021-06-11 DIAGNOSIS — F419 Anxiety disorder, unspecified: Secondary | ICD-10-CM

## 2021-06-11 MED ORDER — LISINOPRIL-HYDROCHLOROTHIAZIDE 20-12.5 MG PO TABS: 1.0000 | ORAL_TABLET | Freq: Every day | ORAL | 0 refills | Status: DC

## 2021-06-11 MED ORDER — METOPROLOL SUCCINATE ER 50 MG PO TB24: 50.0000 mg | ORAL_TABLET | Freq: Every day | ORAL | 1 refills | Status: DC

## 2021-06-11 NOTE — Assessment & Plan Note (Signed)
BP Readings from Last 1 Encounters:  06/11/21 (!) 184/84   Uncontrolled, noncompliant Anxiety also a contributing factor, denies to take medication for anxiety On Metoprolol 50 mg QD, refilled Switched to Lisinopril-HCTZ, check BMP Counseled for compliance with the medications Advised DASH diet and moderate exercise/walking, at least 150 mins/week  EKG: Sinus rhythm. HR 76. No signs of active ischemia.

## 2021-06-11 NOTE — Assessment & Plan Note (Signed)
Reports h/o anxiety Likely one of the contributing factor for HTN Denies to take any medications for it

## 2021-06-11 NOTE — Assessment & Plan Note (Signed)
On B-blocker Advised to cut down caffeinated products 

## 2021-06-11 NOTE — Progress Notes (Signed)
Established Patient Office Visit  Subjective:  Patient ID: Andrew Howe, male    DOB: [birth year]  Age: 40 y.o. MRN: [id number]  CC:  Chief Complaint  Patient presents with   Follow-up    6 month follow up     HPI Andrew Howe is a 40 year old male with PMH of HTN, anxiety and essential tremors who presents for follow up of his chronic medical conditions.  HTN: States that his BP stays around 130s/80s at home and feels anxious in the office. His BP was elevated on multiple measurements. Denies any headache, dizziness, chest pain, dyspnea or palpitations.   Patient also has h/o essential tremors and seems that is the reason he was placed on B-blocker for his HTN.  Patient mentions that he can not afford to follow up frequently. Patient is advised to get at least BMP done and will be seen again after 3 months. He mentions that he works only about 12 hours per week and does not have insurance. He does not know how to apply for government funded insurance. He was given reference of health department to get help with insurance application.  Past Medical History:  Diagnosis Date   Anxiety    Depression    Depression    Phreesia 10/15/2020   Hypertension 12/02/2014   Phreesia 10/15/2020    History reviewed. No pertinent surgical history.  History reviewed. No pertinent family history.  Social History   Socioeconomic History   Marital status: Single    Spouse name: Not on file   Number of children: Not on file   Years of education: Not on file   Highest education level: Not on file  Occupational History    Employer: [employer]  Tobacco Use   Smoking status: Never   Smokeless tobacco: Never  Substance and Sexual Activity   Alcohol use: No   Drug use: No   Sexual activity: Not on file  Other Topics Concern   Not on file  Social History Narrative   Not on file   Social Determinants of Health   Financial Resource Strain: Not on file  Food Insecurity: Not on file   Transportation Needs: Not on file  Physical Activity: Not on file  Stress: Not on file  Social Connections: Not on file  Intimate Partner Violence: Not on file    Outpatient Medications Prior to Visit  Medication Sig Dispense Refill   lisinopril (ZESTRIL) 5 MG tablet Take 1 tablet (5 mg total) by mouth daily. 90 tablet 1   metoprolol succinate (TOPROL-XL) 50 MG 24 hr tablet Take 1 tablet (50 mg total) by mouth daily. Take with or immediately following a meal. 90 tablet 1   No facility-administered medications prior to visit.    Allergies  Allergen Reactions   Peanut Butter Flavor     ROS Review of Systems  Constitutional:  Negative for chills and fever.  HENT:  Negative for congestion and sore throat.   Eyes:  Negative for pain and discharge.  Respiratory:  Negative for cough and shortness of breath.   Cardiovascular:  Negative for chest pain and palpitations.  Gastrointestinal:  Negative for constipation, diarrhea, nausea and vomiting.  Endocrine: Negative for polydipsia and polyuria.  Genitourinary:  Negative for dysuria and hematuria.  Musculoskeletal:  Negative for neck pain and neck stiffness.  Skin:  Negative for rash.  Neurological:  Negative for dizziness, weakness, numbness and headaches.  Psychiatric/Behavioral:  Negative for agitation, behavioral problems and sleep disturbance. The patient is  nervous/anxious.      Objective:    Physical Exam Vitals reviewed.  Constitutional:      General: He is not in acute distress.    Appearance: He is not diaphoretic.  HENT:     Head: Normocephalic and atraumatic.     Nose: Nose normal.     Mouth/Throat:     Mouth: Mucous membranes are moist.  Eyes:     General: No scleral icterus.    Extraocular Movements: Extraocular movements intact.  Cardiovascular:     Rate and Rhythm: Regular rhythm. Tachycardia present.     Pulses: Normal pulses.     Heart sounds: Normal heart sounds. No murmur heard. Pulmonary:      Breath sounds: Normal breath sounds. No wheezing or rales.  Abdominal:     Palpations: Abdomen is soft.     Tenderness: There is no abdominal tenderness.  Musculoskeletal:     Cervical back: Neck supple. No tenderness.     Right lower leg: No edema.     Left lower leg: No edema.  Skin:    General: Skin is warm.     Findings: No rash.  Neurological:     General: No focal deficit present.     Mental Status: He is alert and oriented to person, place, and time.     Sensory: No sensory deficit.     Motor: No weakness.  Psychiatric:        Mood and Affect: Mood normal.        Behavior: Behavior normal.    BP (!) 184/84 (BP Location: Left Arm, Cuff Size: Normal)   Pulse 85   Temp 98.3 F (36.8 C) (Oral)   Resp 18   Ht 6\' 1"  (1.854 m)   Wt 242 lb (109.8 kg)   SpO2 96%   BMI 31.93 kg/m  Wt Readings from Last 3 Encounters:  06/11/21 242 lb (109.8 kg)  10/16/20 235 lb (106.6 kg)  02/26/18 230 lb (104.3 kg)     Health Maintenance Due  Topic Date Due   COVID-19 Vaccine (1) Never done   Hepatitis C Screening  Never done   TETANUS/TDAP  Never done   INFLUENZA VACCINE  06/03/2021    There are no preventive care reminders to display for this patient.  No results found for: TSH Lab Results  Component Value Date   WBC 7.8 02/26/2018   HGB 15.3 02/26/2018   HCT 45.2 02/26/2018   MCV 84.2 02/26/2018   PLT 232 02/26/2018   Lab Results  Component Value Date   NA 138 02/26/2018   K 4.1 02/26/2018   CO2 26 02/26/2018   GLUCOSE 100 (H) 02/26/2018   BUN 14 02/26/2018   CREATININE 1.00 02/26/2018   BILITOT 0.5 06/06/2013   ALKPHOS 56 06/06/2013   AST 13 06/06/2013   ALT 15 06/06/2013   PROT 7.9 06/06/2013   ALBUMIN 4.5 06/06/2013   CALCIUM 9.7 02/26/2018   ANIONGAP 12 02/26/2018   No results found for: CHOL No results found for: HDL No results found for: LDLCALC No results found for: TRIG No results found for: CHOLHDL No results found for: 02/28/2018    Assessment &  Plan:   Problem List Items Addressed This Visit       Cardiovascular and Mediastinum   Primary hypertension - Primary    BP Readings from Last 1 Encounters:  06/11/21 (!) 184/84  Uncontrolled, noncompliant Anxiety also a contributing factor, denies to take medication for anxiety On Metoprolol  50 mg QD, refilled Switched to Lisinopril-HCTZ, check BMP Counseled for compliance with the medications Advised DASH diet and moderate exercise/walking, at least 150 mins/week  EKG: Sinus rhythm. HR 76. No signs of active ischemia.      Relevant Medications   lisinopril-hydrochlorothiazide (ZESTORETIC) 20-12.5 MG tablet   metoprolol succinate (TOPROL-XL) 50 MG 24 hr tablet   Other Relevant Orders   Basic Metabolic Panel (BMET)     Nervous and Auditory   Essential tremor    On B-blocker Advised to cut down caffeinated products         Other   Anxiety    Reports h/o anxiety Likely one of the contributing factor for HTN Denies to take any medications for it       Other Visit Diagnoses     Need for hepatitis C screening test       Relevant Orders   Hepatitis C Antibody   Encounter for screening for HIV       Relevant Orders   HIV antibody (with reflex)       Meds ordered this encounter  Medications   lisinopril-hydrochlorothiazide (ZESTORETIC) 20-12.5 MG tablet    Sig: Take 1 tablet by mouth daily.    Dispense:  90 tablet    Refill:  0   metoprolol succinate (TOPROL-XL) 50 MG 24 hr tablet    Sig: Take 1 tablet (50 mg total) by mouth daily. Take with or immediately following a meal.    Dispense:  90 tablet    Refill:  1    Follow-up: Return in about 3 months (around 09/11/2021) for HTN.    Anabel Halon, MD

## 2021-06-11 NOTE — Patient Instructions (Signed)
Please start taking Lisinopril-HCTZ instead of Lisinopril.  Please continue to follow low salt diet and perform moderate exercise/walking at least 150 mins/week.

## 2021-08-07 ENCOUNTER — Emergency Department (HOSPITAL_COMMUNITY): Payer: Self-pay

## 2021-08-07 ENCOUNTER — Encounter (HOSPITAL_COMMUNITY): Payer: Self-pay

## 2021-08-07 ENCOUNTER — Other Ambulatory Visit: Payer: Self-pay

## 2021-08-07 ENCOUNTER — Emergency Department (HOSPITAL_COMMUNITY)
Admission: EM | Admit: 2021-08-07 | Discharge: 2021-08-07 | Disposition: A | Discharge status: Home / Self Care | Payer: Self-pay | Attending: Emergency Medicine | Admitting: Emergency Medicine

## 2021-08-07 DIAGNOSIS — I10 Essential (primary) hypertension: Secondary | ICD-10-CM | POA: Insufficient documentation

## 2021-08-07 DIAGNOSIS — N50811 Right testicular pain: Secondary | ICD-10-CM | POA: Insufficient documentation

## 2021-08-07 DIAGNOSIS — R109 Unspecified abdominal pain: Secondary | ICD-10-CM | POA: Insufficient documentation

## 2021-08-07 DIAGNOSIS — N50812 Left testicular pain: Secondary | ICD-10-CM | POA: Insufficient documentation

## 2021-08-07 DIAGNOSIS — Z79899 Other long term (current) drug therapy: Secondary | ICD-10-CM | POA: Insufficient documentation

## 2021-08-07 LAB — URINALYSIS, ROUTINE W REFLEX MICROSCOPIC
Bilirubin Urine: NEGATIVE
Glucose, UA: NEGATIVE mg/dL
Hgb urine dipstick: NEGATIVE
Ketones, ur: NEGATIVE mg/dL
Leukocytes,Ua: NEGATIVE
Nitrite: NEGATIVE
Protein, ur: NEGATIVE mg/dL
Specific Gravity, Urine: 1.005 (ref 1.005–1.030)
pH: 6 (ref 5.0–8.0)

## 2021-08-07 LAB — BASIC METABOLIC PANEL
Anion gap: 8 (ref 5–15)
BUN: 11 mg/dL (ref 6–20)
CO2: 26 mmol/L (ref 22–32)
Calcium: 9.3 mg/dL (ref 8.9–10.3)
Chloride: 104 mmol/L (ref 98–111)
Creatinine, Ser: 0.98 mg/dL (ref 0.61–1.24)
GFR, Estimated: >60 mL/min (ref >60)
Glucose, Bld: 84 mg/dL (ref 70–99)
Potassium: 4.1 mmol/L (ref 3.5–5.1)
Sodium: 138 mmol/L (ref 135–145)

## 2021-08-07 LAB — CBC
HCT: 42.1 % (ref 39.0–52.0)
Hemoglobin: 14.6 g/dL (ref 13.0–17.0)
MCH: 29.7 pg (ref 26.0–34.0)
MCHC: 34.7 g/dL (ref 30.0–36.0)
MCV: 85.7 fL (ref 80.0–100.0)
Platelets: 247 10*3/uL (ref 150–400)
RBC: 4.91 MIL/uL (ref 4.22–5.81)
RDW: 13.2 % (ref 11.5–15.5)
WBC: 6.7 10*3/uL (ref 4.0–10.5)
nRBC: 0 % (ref 0.0–0.2)

## 2021-08-07 MED ORDER — IOHEXOL 300 MG/ML  SOLN
100.0000 mL | Freq: Once | INTRAMUSCULAR | Status: AC | PRN
  Administered 2021-08-07: 100 mL via INTRAVENOUS

## 2021-08-07 NOTE — ED Triage Notes (Signed)
Bilat shooting pain in both testicles x 1 week. 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 States pain went away but then came back. States pain radiates to generalized pelvis pain. No skin discoloration. Urinary frequency.

## 2021-08-07 NOTE — ED Provider Notes (Signed)
Patient's CT scan without any acute abnormalities.  Urinalysis normal.  Labs normal will have patient follow-up with urology.  Use nonsteroidals for the pain and inflammation and potential inflammation.   Vanetta Mulders, MD 08/07/21 1744

## 2021-08-07 NOTE — ED Provider Notes (Signed)
[facility] EMERGENCY DEPARTMENT Provider Note   CSN: [id number] Arrival date & time: 08/07/21  1211     History Chief Complaint  Patient presents with   Testicle Pain    Andrew Howe is a 40 y.o. male.  Pt c/o bilateral testicle pain in past few days. Symptoms acute onset, moderate, constant, persistent, non radiating, dull. Denies trauma to area. States was doing some lifting/work when pain started. Denies focal redness or swelling to area. No dysuria, hematuria or penile discharge. +lower abd pain. Normal appetite. No fever or chills. No back or flank pain. No hx testicle abn/problems, no hx uti or prostatitis, no hx kidney stone.   The history is provided by the patient and medical records.  Testicle Pain Associated symptoms include abdominal pain. Pertinent negatives include no chest pain, no headaches and no shortness of breath.      Past Medical History:  Diagnosis Date   Anxiety    Depression    Depression    Phreesia 10/15/2020   Hypertension 12/02/2014   Phreesia 10/15/2020    Patient Active Problem List   Diagnosis Date Noted   Primary hypertension 10/16/2020   Essential tremor 10/16/2020   Anxiety 10/16/2020    History reviewed. No pertinent surgical history.     History reviewed. No pertinent family history.  Social History   Tobacco Use   Smoking status: Never   Smokeless tobacco: Never  Substance Use Topics   Alcohol use: No   Drug use: No    Home Medications Prior to Admission medications   Medication Sig Start Date End Date Taking? Authorizing Provider  lisinopril-hydrochlorothiazide (ZESTORETIC) 20-12.5 MG tablet Take 1 tablet by mouth daily. 06/11/21  Yes Anabel Halon, MD  metoprolol succinate (TOPROL-XL) 50 MG 24 hr tablet Take 1 tablet (50 mg total) by mouth daily. Take with or immediately following a meal. 06/11/21  Yes Anabel Halon, MD    Allergies    Peanut butter flavor  Review of Systems   Review of Systems   Constitutional:  Negative for chills and fever.  HENT:  Negative for sore throat.   Eyes:  Negative for redness.  Respiratory:  Negative for shortness of breath.   Cardiovascular:  Negative for chest pain.  Gastrointestinal:  Positive for abdominal pain. Negative for nausea and vomiting.  Genitourinary:  Positive for testicular pain. Negative for dysuria and flank pain.  Musculoskeletal:  Negative for back pain.  Skin:  Negative for rash.  Neurological:  Negative for headaches.  Hematological:  Does not bruise/bleed easily.  Psychiatric/Behavioral:  Negative for confusion.    Physical Exam Updated Vital Signs BP (!) 156/99   Pulse 98   Temp 98.6 F (37 C) (Oral)   Resp 20   Ht 1.854 m (6\' 1" )   Wt 106.6 kg   SpO2 96%   BMI 31.00 kg/m   Physical Exam Vitals and nursing note reviewed.  Constitutional:      Appearance: Normal appearance. He is well-developed.  HENT:     Head: Atraumatic.     Nose: Nose normal.     Mouth/Throat:     Mouth: Mucous membranes are moist.  Eyes:     General: No scleral icterus.    Conjunctiva/sclera: Conjunctivae normal.  Neck:     Trachea: No tracheal deviation.  Cardiovascular:     Rate and Rhythm: Normal rate.     Pulses: Normal pulses.  Pulmonary:     Effort: Pulmonary effort is normal. No accessory muscle  usage or respiratory distress.  Abdominal:     General: Bowel sounds are normal. There is no distension.     Palpations: Abdomen is soft. There is no mass.     Tenderness: There is no abdominal tenderness. There is no guarding or rebound.     Hernia: No hernia is present.  Genitourinary:    Comments: No cva tenderness. Normal external gu exam. Testicles/scrotum not grossly swollen, no erythema or skin changes/lesions. No penile discharge. No femoral/inguinal l/a.  Musculoskeletal:        General: No swelling.     Cervical back: Neck supple.  Skin:    General: Skin is warm and dry.     Findings: No rash.     Comments: No skin  or subcutaneous abscess in area of pain.   Neurological:     Mental Status: He is alert.     Comments: Alert, speech clear.   Psychiatric:        Mood and Affect: Mood normal.    ED Results / Procedures / Treatments   Labs (all labs ordered are listed, but only abnormal results are displayed) Labs Reviewed - No data to display  EKG None  Radiology US SCROTUM W/DOPPLER  Result Date: 08/07/2021 CLINICAL DATA:  Left testis pain for 7 days EXAM: SCROTAL ULTRASOUND DOPPLER ULTRASOUND OF THE TESTICLES TECHNIQUE: Complete ultrasound examination of the testicles, epididymis, and other scrotal structures was performed. Color and spectral Doppler ultrasound were also utilized to evaluate blood flow to the testicles. COMPARISON:  None. FINDINGS: Right testicle Measurements: 4.0 x 1.8 x 2.8 cm. 1 mm calcification identified. No mass. Left testicle Measurements: 3.9 x 1.9 x 2.6 cm. No mass or microlithiasis visualized. Right epididymis:  7 x 6 x 7 mm cyst noted. Left epididymis:  Normal in size and appearance. Hydrocele:  Small right hydrocele. Varicocele:  None visualized. Pulsed Doppler interrogation of both testes demonstrates normal low resistance arterial and venous waveforms bilaterally. IMPRESSION: 1. No testicular mass or evidence for testicular torsion. 2. Right epididymal cyst. 3. Small right hydrocele. Electronically Signed   By: Signa Kell M.D.   On: 08/07/2021 14:21    Procedures Procedures   Medications Ordered in ED Medications - No data to display  ED Course  I have reviewed the triage vital signs and the nursing notes.  Pertinent labs & imaging results that were available during my care of the patient were reviewed by me and considered in my medical decision making (see chart for details).    MDM Rules/Calculators/A&P                           U/s imaging ordered.   Reviewed nursing notes and prior charts for additional history.   Await UA.   U/s  reviewed/interpreted by me - no torsion. +hydrocele/cyst.   On recheck, pt now also c/o increased lower abd pain, bil lower abd tenderness on repeat exam. Will add labs and ct imaging.   Labs remains pending.   1550 labs and ct pending - signed out to Dr Riesa Pope.    Final Clinical Impression(s) / ED Diagnoses Final diagnoses:  None    Rx / DC Orders ED Discharge Orders     None        Cathren Laine, MD 08/07/21 1552

## 2021-08-07 NOTE — Discharge Instructions (Addendum)
Urinalysis normal.  No signs of infection.  Labs without any abnormalities.  Ultrasound of the testicles without any abnormalities.  CT scan abdomen and pelvis without any abnormalities.  Recommend taking Aleve at least for the next 5 days.  Make an appointment to follow-up with urology.  Information provided above.  Return for any new or worse symptoms.

## 2021-09-11 ENCOUNTER — Ambulatory Visit: Payer: Self-pay | Admitting: Internal Medicine

## 2021-10-16 ENCOUNTER — Ambulatory Visit: Payer: Self-pay | Admitting: Internal Medicine

## 2022-01-21 ENCOUNTER — Other Ambulatory Visit: Payer: Self-pay

## 2022-01-21 ENCOUNTER — Encounter: Payer: Self-pay | Admitting: Internal Medicine

## 2022-01-21 ENCOUNTER — Ambulatory Visit (INDEPENDENT_AMBULATORY_CARE_PROVIDER_SITE_OTHER): Payer: Self-pay | Admitting: Internal Medicine

## 2022-01-21 VITALS — BP 152/106 | HR 92 | Resp 18 | Ht 73.0 in | Wt 238.4 lb

## 2022-01-21 DIAGNOSIS — G250 Essential tremor: Secondary | ICD-10-CM

## 2022-01-21 DIAGNOSIS — I10 Essential (primary) hypertension: Secondary | ICD-10-CM

## 2022-01-21 MED ORDER — LISINOPRIL-HYDROCHLOROTHIAZIDE 20-12.5 MG PO TABS: 1.0000 | ORAL_TABLET | Freq: Every day | ORAL | 1 refills | Status: DC

## 2022-01-21 MED ORDER — METOPROLOL SUCCINATE ER 50 MG PO TB24: 50.0000 mg | ORAL_TABLET | Freq: Every day | ORAL | 1 refills | Status: DC

## 2022-01-21 NOTE — Patient Instructions (Signed)
Please start taking Lisinopril-HCTZ as prescribed. ? ?Please continue taking Metoprolol as prescribed. ? ?Please continue to follow low salt diet and perform moderate exercise/walking at least 150 mins/week. ?

## 2022-01-22 NOTE — Assessment & Plan Note (Signed)
On B-blocker Advised to cut down caffeinated products 

## 2022-01-22 NOTE — Progress Notes (Signed)
? ?Established Patient Office Visit ? ?Subjective:  ?Patient ID: Andrew Howe, male    DOB: 1981-08-22  Age: 41 y.o. MRN: [id number] ? ?CC:  ?Chief Complaint  ?Patient presents with  ? Follow-up  ?  3 month follow up HTN   ? ? ?HPI ?Andrew Howe is a 41 y.o. male with past medical history ofHTN, anxiety and essential tremors who presents for follow up of his chronic medical conditions. ?  ?HTN: States that his BP stays around 130s/80s at home and feels anxious in the office. His BP was elevated on multiple measurements. Denies any headache, dizziness, chest pain, dyspnea or palpitations. ?  ?Patient also has h/o essential tremors and seems that is the reason he was placed on B-blocker for his HTN. ? ?Patient mentions that he can not afford to follow up frequently. He was given reference of health department to get help with insurance application in the last visit. Today, we have provided contact information of free clinic in [place] as well for closer follow up. ? ? ?Past Medical History:  ?Diagnosis Date  ? Anxiety   ? Depression   ? Depression   ? Phreesia 10/15/2020  ? Hypertension 12/02/2014  ? Phreesia 10/15/2020  ? ? ?History reviewed. No pertinent surgical history. ? ?History reviewed. No pertinent family history. ? ?Social History  ? ?Socioeconomic History  ? Marital status: Single  ?  Spouse name: Not on file  ? Number of children: Not on file  ? Years of education: Not on file  ? Highest education level: Not on file  ?Occupational History  ?  Employer: [employer]  ?Tobacco Use  ? Smoking status: Never  ? Smokeless tobacco: Never  ?Substance and Sexual Activity  ? Alcohol use: No  ? Drug use: No  ? Sexual activity: Not on file  ?Other Topics Concern  ? Not on file  ?Social History Narrative  ? Not on file  ? ?Social Determinants of Health  ? ?Financial Resource Strain: Not on file  ?Food Insecurity: Not on file  ?Transportation Needs: Not on file  ?Physical Activity: Not on file  ?Stress: Not on file   ?Social Connections: Not on file  ?Intimate Partner Violence: Not on file  ? ? ?Outpatient Medications Prior to Visit  ?Medication Sig Dispense Refill  ? lisinopril-hydrochlorothiazide (ZESTORETIC) 20-12.5 MG tablet Take 1 tablet by mouth daily. 90 tablet 0  ? metoprolol succinate (TOPROL-XL) 50 MG 24 hr tablet Take 1 tablet (50 mg total) by mouth daily. Take with or immediately following a meal. 90 tablet 1  ? ?No facility-administered medications prior to visit.  ? ? ?Allergies  ?Allergen Reactions  ? Peanut Butter Flavor   ? ? ?ROS ?Review of Systems  ?Constitutional:  Negative for chills and fever.  ?HENT:  Negative for congestion and sore throat.   ?Eyes:  Negative for pain and discharge.  ?Respiratory:  Negative for cough and shortness of breath.   ?Cardiovascular:  Negative for chest pain and palpitations.  ?Gastrointestinal:  Negative for constipation, diarrhea, nausea and vomiting.  ?Endocrine: Negative for polydipsia and polyuria.  ?Genitourinary:  Negative for dysuria and hematuria.  ?Musculoskeletal:  Negative for neck pain and neck stiffness.  ?Skin:  Negative for rash.  ?Neurological:  Negative for dizziness, weakness, numbness and headaches.  ?Psychiatric/Behavioral:  Negative for agitation, behavioral problems and sleep disturbance. The patient is nervous/anxious.   ? ?  ?Objective:  ?  ?Physical Exam ?Vitals reviewed.  ?Constitutional:   ?  General: He is not in acute distress. ?   Appearance: He is not diaphoretic.  ?HENT:  ?   Head: Normocephalic and atraumatic.  ?   Nose: Nose normal.  ?   Mouth/Throat:  ?   Mouth: Mucous membranes are moist.  ?Eyes:  ?   General: No scleral icterus. ?   Extraocular Movements: Extraocular movements intact.  ?Cardiovascular:  ?   Rate and Rhythm: Regular rhythm. Tachycardia present.  ?   Pulses: Normal pulses.  ?   Heart sounds: Normal heart sounds. No murmur heard. ?Pulmonary:  ?   Breath sounds: Normal breath sounds. No wheezing or rales.  ?Musculoskeletal:  ?    Cervical back: Neck supple. No tenderness.  ?   Right lower leg: No edema.  ?   Left lower leg: No edema.  ?Skin: ?   General: Skin is warm.  ?   Findings: No rash.  ?Neurological:  ?   General: No focal deficit present.  ?   Mental Status: He is alert and oriented to person, place, and time.  ?   Sensory: No sensory deficit.  ?   Motor: No weakness.  ?Psychiatric:     ?   Mood and Affect: Mood normal.     ?   Behavior: Behavior normal.  ? ? ?BP (!) 152/106 (BP Location: Right Arm, Cuff Size: Normal)   Pulse 92   Resp 18   Ht 6\' 1"  (1.854 m)   Wt 238 lb 6.4 oz (108.1 kg)   SpO2 97%   BMI 31.45 kg/m?  ?Wt Readings from Last 3 Encounters:  ?01/21/22 238 lb 6.4 oz (108.1 kg)  ?08/07/21 235 lb (106.6 kg)  ?06/11/21 242 lb (109.8 kg)  ? ? ?No results found for: TSH ?Lab Results  ?Component Value Date  ? WBC 6.7 08/07/2021  ? HGB 14.6 08/07/2021  ? HCT 42.1 08/07/2021  ? MCV 85.7 08/07/2021  ? PLT 247 08/07/2021  ? ?Lab Results  ?Component Value Date  ? NA 138 08/07/2021  ? K 4.1 08/07/2021  ? CO2 26 08/07/2021  ? GLUCOSE 84 08/07/2021  ? BUN 11 08/07/2021  ? CREATININE 0.98 08/07/2021  ? BILITOT 0.5 06/06/2013  ? ALKPHOS 56 06/06/2013  ? AST 13 06/06/2013  ? ALT 15 06/06/2013  ? PROT 7.9 06/06/2013  ? ALBUMIN 4.5 06/06/2013  ? CALCIUM 9.3 08/07/2021  ? ANIONGAP 8 08/07/2021  ? ?No results found for: CHOL ?No results found for: HDL ?No results found for: [place] ?No results found for: TRIG ?No results found for: CHOLHDL ?No results found for: HGBA1C ? ?  ?Assessment & Plan:  ? ?Problem List Items Addressed This Visit   ? ?  ? Cardiovascular and Mediastinum  ? Primary hypertension - Primary  ?  BP Readings from Last 1 Encounters:  ?01/21/22 (!) 152/106  ?Uncontrolled, noncompliant ?Anxiety also a contributing factor, denies to take medication for anxiety ?On Metoprolol 50 mg QD, refilled ?Had added Lisinopril-HCTZ, but he did not continue it, check BMP ?Counseled for compliance with the medications ?Advised DASH  diet and moderate exercise/walking, at least 150 mins/week ? ?I have discussed about seeing Free clinic in [facility] so he can be closely followed. He is not able to come to [facility] for closer follow up due to financial restrictions, provided contact information of free clinic. ?  ?  ? Relevant Medications  ? metoprolol succinate (TOPROL-XL) 50 MG 24 hr tablet  ? lisinopril-hydrochlorothiazide (ZESTORETIC) 20-12.5 MG tablet  ?  ?  Nervous and Auditory  ? Essential tremor  ?  On B-blocker ?Advised to cut down caffeinated products ?  ?  ? ? ?Meds ordered this encounter  ?Medications  ? metoprolol succinate (TOPROL-XL) 50 MG 24 hr tablet  ?  Sig: Take 1 tablet (50 mg total) by mouth daily. Take with or immediately following a meal.  ?  Dispense:  90 tablet  ?  Refill:  1  ? lisinopril-hydrochlorothiazide (ZESTORETIC) 20-12.5 MG tablet  ?  Sig: Take 1 tablet by mouth daily.  ?  Dispense:  90 tablet  ?  Refill:  1  ? ? ?Follow-up: Return in about 6 months (around 07/24/2022), or if symptoms worsen or fail to improve.  ? ? ?Lindell Spar, MD ?

## 2022-01-22 NOTE — Assessment & Plan Note (Addendum)
BP Readings from Last 1 Encounters:  ?01/21/22 (!) 152/106  ? ?Uncontrolled, noncompliant ?Anxiety also a contributing factor, denies to take medication for anxiety ?On Metoprolol 50 mg QD, refilled ?Had added Lisinopril-HCTZ, but he did not continue it, check BMP ?Counseled for compliance with the medications ?Advised DASH diet and moderate exercise/walking, at least 150 mins/week ? ?I have discussed about seeing Free clinic in [facility] so he can be closely followed. He is not able to come to [facility] for closer follow up due to financial restrictions, provided contact information of free clinic. ?

## 2022-06-25 ENCOUNTER — Telehealth: Payer: Self-pay | Admitting: Internal Medicine

## 2022-06-25 NOTE — Telephone Encounter (Signed)
Patient aware.

## 2022-06-25 NOTE — Telephone Encounter (Signed)
Patient called in wants to know if its safe for him to take NERVIVE  Wants a call back

## 2022-07-24 ENCOUNTER — Encounter: Payer: Self-pay | Admitting: Internal Medicine

## 2022-07-24 ENCOUNTER — Ambulatory Visit (INDEPENDENT_AMBULATORY_CARE_PROVIDER_SITE_OTHER): Payer: Self-pay | Admitting: Internal Medicine

## 2022-07-24 VITALS — BP 138/88 | HR 82 | Ht 73.5 in | Wt 240.8 lb

## 2022-07-24 DIAGNOSIS — Z2821 Immunization not carried out because of patient refusal: Secondary | ICD-10-CM

## 2022-07-24 DIAGNOSIS — I10 Essential (primary) hypertension: Secondary | ICD-10-CM

## 2022-07-24 DIAGNOSIS — G250 Essential tremor: Secondary | ICD-10-CM

## 2022-07-24 MED ORDER — LISINOPRIL-HYDROCHLOROTHIAZIDE 20-12.5 MG PO TABS: 1.0000 | ORAL_TABLET | Freq: Every day | ORAL | 1 refills | Status: DC

## 2022-07-24 MED ORDER — METOPROLOL SUCCINATE ER 50 MG PO TB24: 50.0000 mg | ORAL_TABLET | Freq: Every day | ORAL | 1 refills | Status: DC

## 2022-07-24 NOTE — Assessment & Plan Note (Signed)
BP Readings from Last 1 Encounters:  07/24/22 138/88   Well-controlled now Anxiety also a contributing factor, denies to take medication for anxiety On Metoprolol 50 mg QD, refilled Had added Lisinopril-HCTZ, check BMP Counseled for compliance with the medications Advised DASH diet and moderate exercise/walking, at least 150 mins/week  I have discussed about seeing [facility] clinic in [facility] so he can be closely followed. He is not able to come to [facility] for closer follow up due to financial restrictions, provided contact information of free clinic again today.

## 2022-07-24 NOTE — Assessment & Plan Note (Signed)
On B-blocker Advised to cut down caffeinated products

## 2022-07-24 NOTE — Patient Instructions (Addendum)
Please continue to take medications as prescribed.  Please get blood test done at [place] or at our office.  [facility] [address], [place], [place]  [address] [phone]

## 2022-07-24 NOTE — Progress Notes (Signed)
Established Patient Office Visit  Subjective:  Patient ID: Andrew Howe, male    DOB: 1981-01-27  Age: 41 y.o. MRN: [id number]  CC:  Chief Complaint  Patient presents with   Follow-up    Follow up needs bp medication refills     HPI Andrew Howe is a 41 y.o. male with past medical history of HTN, anxiety and essential tremors who presents for follow up of his chronic medical conditions.  HTN: States that his BP stays around 130s/80s at home and feels anxious in the office. His BP was elevated on initially, but improved later. Denies any headache, dizziness, chest pain, dyspnea or palpitations.  Patient also has h/o essential tremors and seems that is the reason he was placed on B-blocker for his HTN.    Past Medical History:  Diagnosis Date   Anxiety    Depression    Depression    Phreesia 10/15/2020   Hypertension 12/02/2014   Phreesia 10/15/2020    History reviewed. No pertinent surgical history.  History reviewed. No pertinent family history.  Social History   Socioeconomic History   Marital status: Single    Spouse name: Not on file   Number of children: Not on file   Years of education: Not on file   Highest education level: Not on file  Occupational History    Employer: [employer]  Tobacco Use   Smoking status: Never   Smokeless tobacco: Never  Substance and Sexual Activity   Alcohol use: No   Drug use: No   Sexual activity: Not on file  Other Topics Concern   Not on file  Social History Narrative   Not on file   Social Determinants of Health   Financial Resource Strain: Not on file  Food Insecurity: Not on file  Transportation Needs: Not on file  Physical Activity: Not on file  Stress: Not on file  Social Connections: Not on file  Intimate Partner Violence: Not on file    Outpatient Medications Prior to Visit  Medication Sig Dispense Refill   lisinopril-hydrochlorothiazide (ZESTORETIC) 20-12.5 MG tablet Take 1 tablet by mouth daily.  90 tablet 1   metoprolol succinate (TOPROL-XL) 50 MG 24 hr tablet Take 1 tablet (50 mg total) by mouth daily. Take with or immediately following a meal. 90 tablet 1   No facility-administered medications prior to visit.    No Active Allergies  ROS Review of Systems  Constitutional:  Negative for chills and fever.  HENT:  Negative for congestion and sore throat.   Eyes:  Negative for pain and discharge.  Respiratory:  Negative for cough and shortness of breath.   Cardiovascular:  Negative for chest pain and palpitations.  Gastrointestinal:  Negative for constipation, diarrhea, nausea and vomiting.  Endocrine: Negative for polydipsia and polyuria.  Genitourinary:  Negative for dysuria and hematuria.  Musculoskeletal:  Negative for neck pain and neck stiffness.  Skin:  Negative for rash.  Neurological:  Negative for dizziness, weakness, numbness and headaches.  Psychiatric/Behavioral:  Negative for agitation, behavioral problems and sleep disturbance. The patient is nervous/anxious.       Objective:    Physical Exam Vitals reviewed.  Constitutional:      General: He is not in acute distress.    Appearance: He is not diaphoretic.  HENT:     Head: Normocephalic and atraumatic.     Nose: Nose normal.     Mouth/Throat:     Mouth: Mucous membranes are moist.  Eyes:  General: No scleral icterus.    Extraocular Movements: Extraocular movements intact.  Cardiovascular:     Rate and Rhythm: Regular rhythm. Tachycardia present.     Pulses: Normal pulses.     Heart sounds: Normal heart sounds. No murmur heard. Pulmonary:     Breath sounds: Normal breath sounds. No wheezing or rales.  Musculoskeletal:     Cervical back: Neck supple. No tenderness.     Right lower leg: No edema.     Left lower leg: No edema.  Skin:    General: Skin is warm.     Findings: No rash.  Neurological:     General: No focal deficit present.     Mental Status: He is alert and oriented to person,  place, and time.  Psychiatric:        Mood and Affect: Mood normal.        Behavior: Behavior normal.     BP 138/88 (BP Location: Left Arm, Cuff Size: Normal)   Pulse 82   Ht 6' 1.5" (1.867 m)   Wt 240 lb 12.8 oz (109.2 kg)   SpO2 98%   BMI 31.34 kg/m  Wt Readings from Last 3 Encounters:  07/24/22 240 lb 12.8 oz (109.2 kg)  01/21/22 238 lb 6.4 oz (108.1 kg)  08/07/21 235 lb (106.6 kg)    No results found for: "TSH" Lab Results  Component Value Date   WBC 6.7 08/07/2021   HGB 14.6 08/07/2021   HCT 42.1 08/07/2021   MCV 85.7 08/07/2021   PLT 247 08/07/2021   Lab Results  Component Value Date   NA 138 08/07/2021   K 4.1 08/07/2021   CO2 26 08/07/2021   GLUCOSE 84 08/07/2021   BUN 11 08/07/2021   CREATININE 0.98 08/07/2021   BILITOT 0.5 06/06/2013   ALKPHOS 56 06/06/2013   AST 13 06/06/2013   ALT 15 06/06/2013   PROT 7.9 06/06/2013   ALBUMIN 4.5 06/06/2013   CALCIUM 9.3 08/07/2021   ANIONGAP 8 08/07/2021   No results found for: "CHOL" No results found for: "HDL" No results found for: "LDLCALC" No results found for: "TRIG" No results found for: "CHOLHDL" No results found for: "HGBA1C"    Assessment & Plan:   Problem List Items Addressed This Visit       Cardiovascular and Mediastinum   Primary hypertension - Primary    BP Readings from Last 1 Encounters:  07/24/22 138/88  Well-controlled now Anxiety also a contributing factor, denies to take medication for anxiety On Metoprolol 50 mg QD, refilled Had added Lisinopril-HCTZ, check BMP Counseled for compliance with the medications Advised DASH diet and moderate exercise/walking, at least 150 mins/week  I have discussed about seeing Free clinic in [facility] so he can be closely followed. He is not able to come to [facility] for closer follow up due to financial restrictions, provided contact information of free clinic again today.      Relevant Medications   lisinopril-hydrochlorothiazide (ZESTORETIC)  20-12.5 MG tablet   metoprolol succinate (TOPROL-XL) 50 MG 24 hr tablet   Other Relevant Orders   Basic Metabolic Panel (BMET)     Nervous and Auditory   Essential tremor    On B-blocker Advised to cut down caffeinated products        Other   Refused influenza vaccine    Meds ordered this encounter  Medications   lisinopril-hydrochlorothiazide (ZESTORETIC) 20-12.5 MG tablet    Sig: Take 1 tablet by mouth daily.    Dispense:  90 tablet    Refill:  1   metoprolol succinate (TOPROL-XL) 50 MG 24 hr tablet    Sig: Take 1 tablet (50 mg total) by mouth daily. Take with or immediately following a meal.    Dispense:  90 tablet    Refill:  1    Follow-up: Return if symptoms worsen or fail to improve.    Anabel Halon, MD

## 2023-03-04 ENCOUNTER — Other Ambulatory Visit: Payer: Self-pay | Admitting: Internal Medicine

## 2023-03-04 DIAGNOSIS — I10 Essential (primary) hypertension: Secondary | ICD-10-CM

## 2023-03-06 ENCOUNTER — Other Ambulatory Visit: Payer: Self-pay | Admitting: Internal Medicine

## 2023-03-06 ENCOUNTER — Other Ambulatory Visit: Payer: Self-pay

## 2023-03-06 ENCOUNTER — Telehealth: Payer: Self-pay | Admitting: Internal Medicine

## 2023-03-06 DIAGNOSIS — I10 Essential (primary) hypertension: Secondary | ICD-10-CM

## 2023-03-06 MED ORDER — METOPROLOL SUCCINATE ER 50 MG PO TB24: 50.0000 mg | ORAL_TABLET | Freq: Every day | ORAL | 1 refills | Status: DC

## 2023-03-06 NOTE — Telephone Encounter (Signed)
Refills sent to pharmacy. 

## 2023-03-06 NOTE — Telephone Encounter (Signed)
Prescription Request  03/06/2023  LOV: 07/24/2022  What is the name of the medication or equipment? metoprolol succinate (TOPROL-XL) 50 MG 24 hr tablet   Have you contacted your pharmacy to request a refill? Yes   Which pharmacy would you like this sent to?  [facility] [address], [place] - [address] [place] [address] Phone: [phone] Fax: [phone]    Patient notified that their request is being sent to the clinical staff for review and that they should receive a response within 2 business days.   Please advise at Mobile [phone] (mobile)

## 2023-05-26 IMAGING — CT CT ABD-PELV W/ CM
2 of 5 series · 16 of 46 positions shown, 18 images · IV contrast (omnipaque)
Comparison: None.

CLINICAL DATA: Left lower pelvis and scrotal pain for 1 week. No
injury.

EXAM:
CT ABDOMEN AND PELVIS WITH CONTRAST
TECHNIQUE: Multidetector CT imaging of the abdomen and pelvis was performed
using the standard protocol following bolus administration of
intravenous contrast.
CONTRAST:  100mL OMNIPAQUE IOHEXOL 300 MG/ML  SOLN

[Series 2: axial st · axial · 0.88mm/px · z∈[+535,+1060]mm · 13 of 116 slices shown, 15 images]
[im 6/116  soft-tissue]
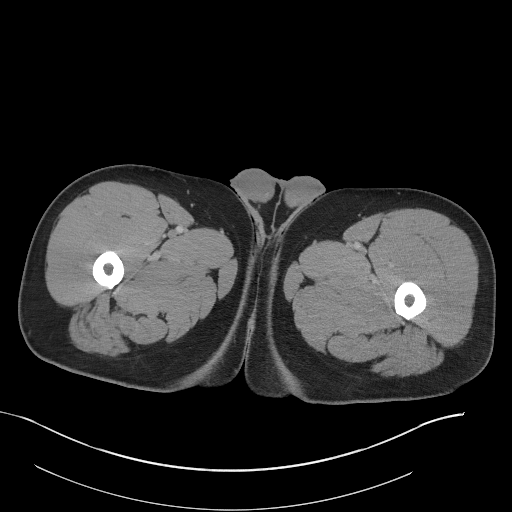
[im 6/116  bone]
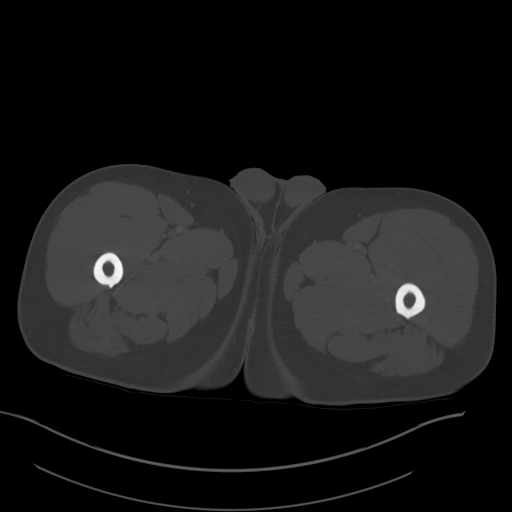
[im 16/116  soft-tissue]
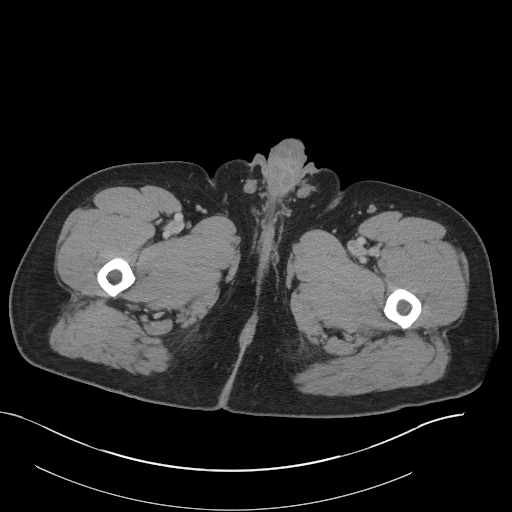
[im 26/116  soft-tissue]
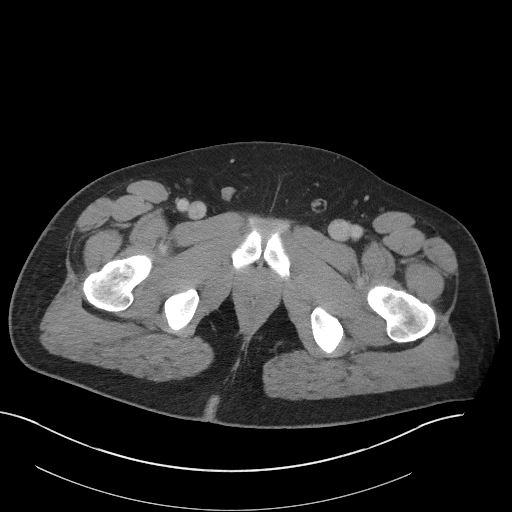
[im 31/116  soft-tissue]
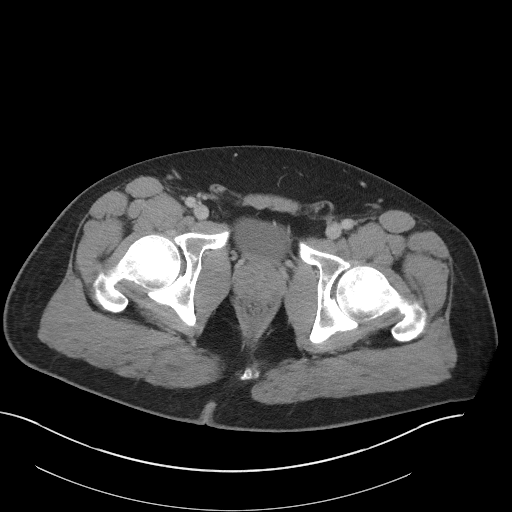
[im 41/116  soft-tissue]
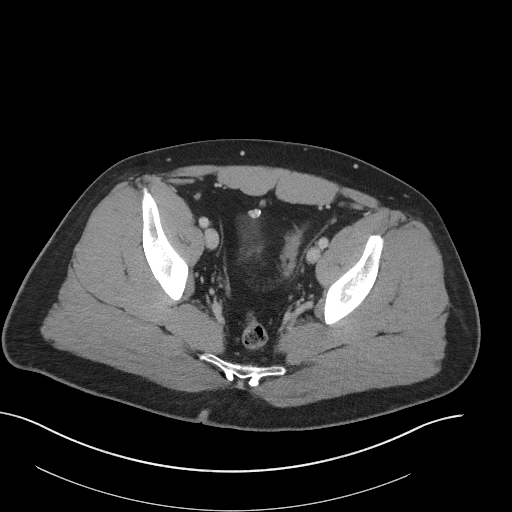
[im 51/116  soft-tissue]
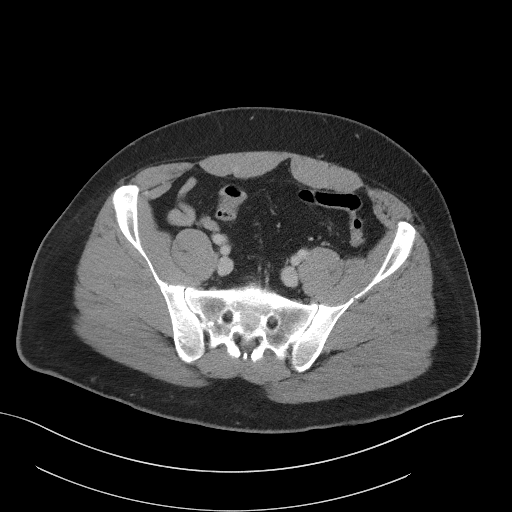
[im 61/116  soft-tissue]
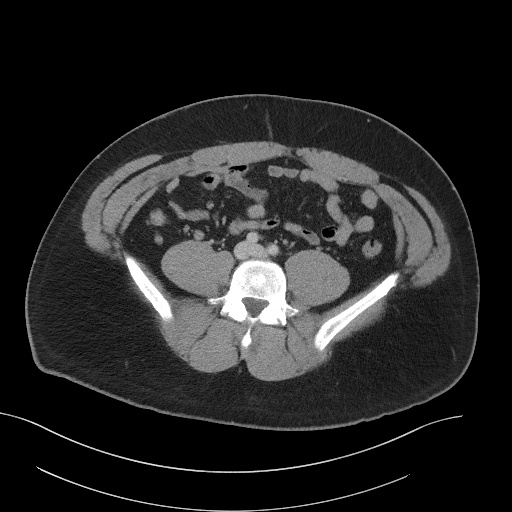
[im 66/116  soft-tissue]
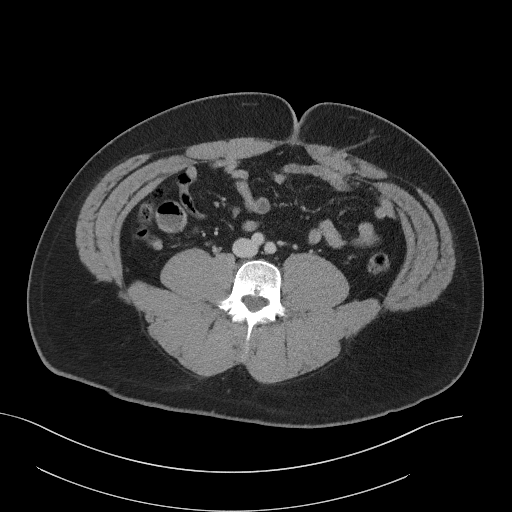
[im 76/116  soft-tissue]
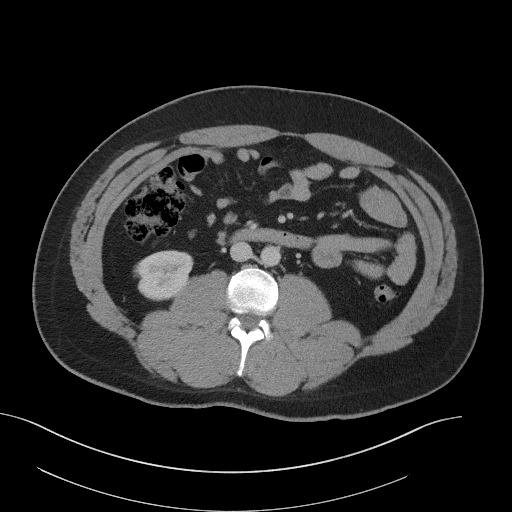
[im 76/116  bone]
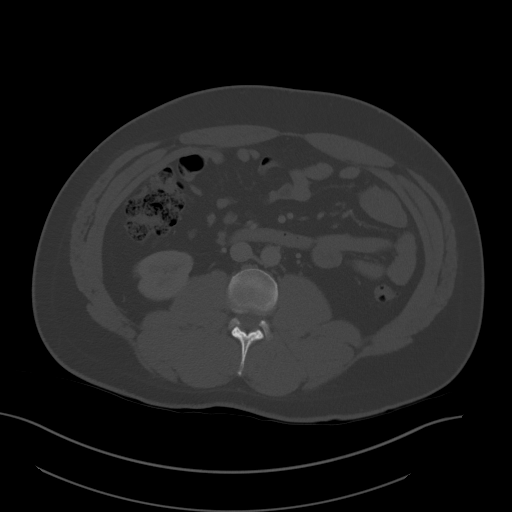
[im 86/116  soft-tissue]
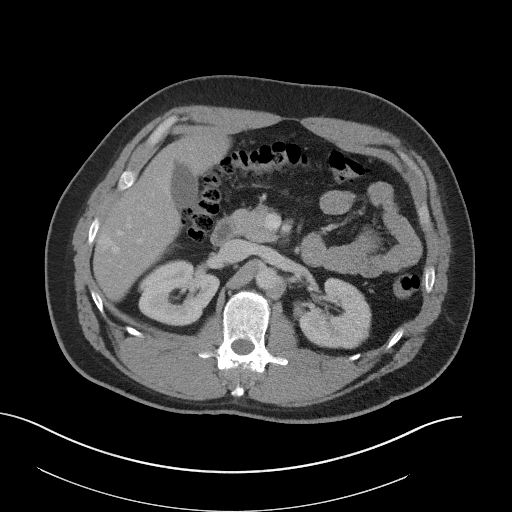
[im 91/116  soft-tissue]
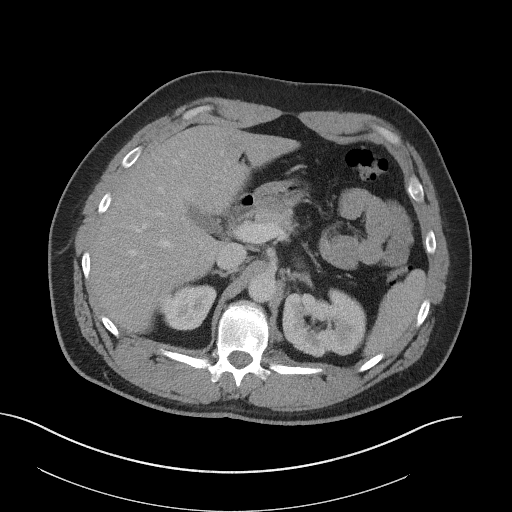
[im 101/116  soft-tissue]
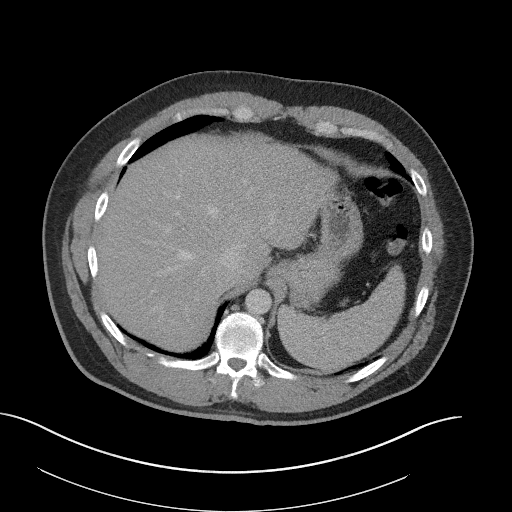
[im 111/116  soft-tissue]
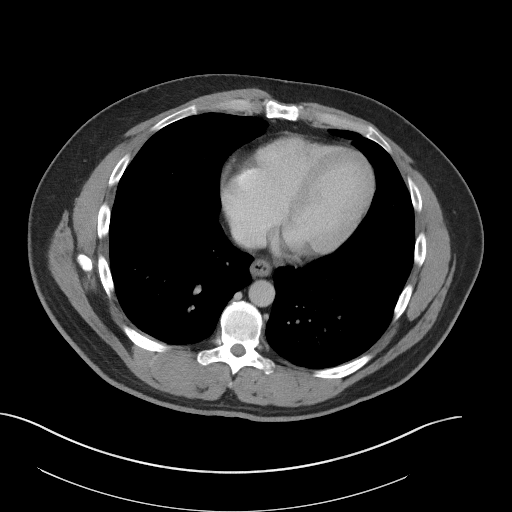

[Series 5: coronal st · coronal · 0.93mm/px · 3 of 116 slices shown]
[im 39/116  soft-tissue]
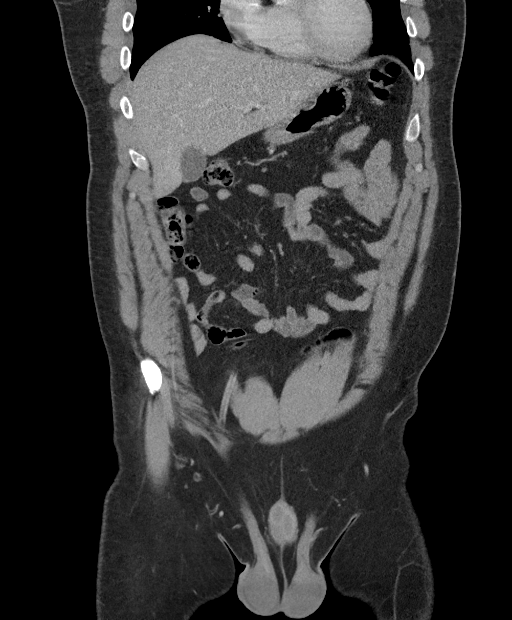
[im 52/116  soft-tissue]
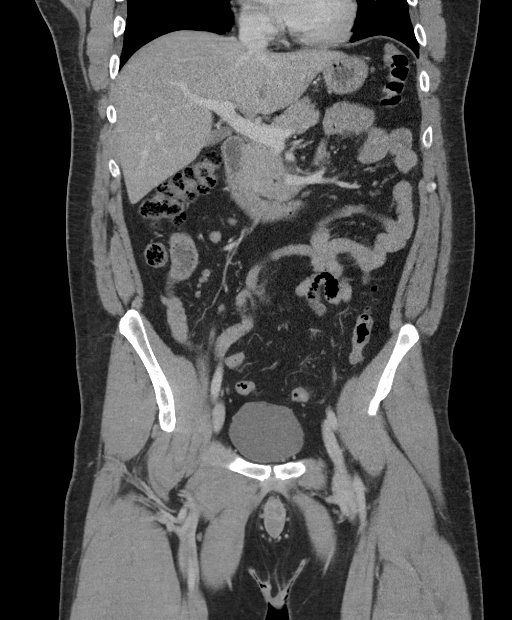
[im 64/116  soft-tissue]
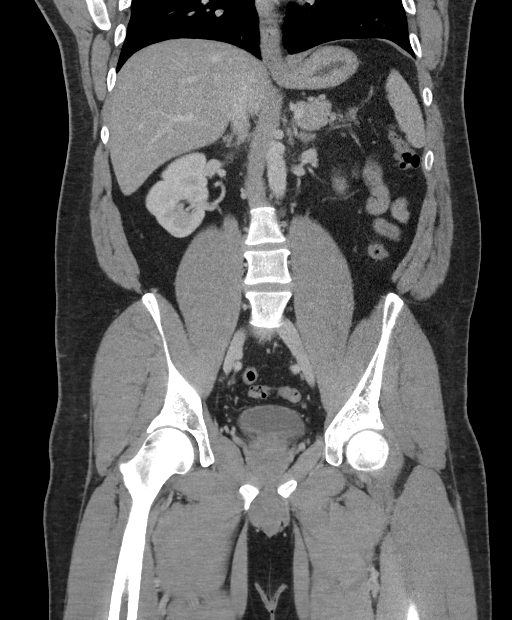

[16 of 46 positions shown; findings below may reference images not displayed]

FINDINGS: Lower chest: Clear lung bases.

Hepatobiliary: Liver normal in size and overall attenuation. Small
focus of increased enhancement, inferior aspect of segment 6, 1.5 cm
size, likely a transient hepatic attenuation difference, incidental.
No other liver abnormalities. Normal gallbladder. No bile duct
dilation.

Pancreas: Unremarkable. No pancreatic ductal dilatation or
surrounding inflammatory changes.

Spleen: Normal in size without focal abnormality.

Adrenals/Urinary Tract: No adrenal masses. Kidneys normal in size,
orientation and position. 1.5 cm low-attenuation mass, lateral
midpole of the left kidney. Subcentimeter low-attenuation cortical
lesion lies adjacent to this, posterior midpole of the left kidney.
Both of these are consistent with cysts. No other renal masses, no
stones and no hydronephrosis. Normal ureters. Normal bladder.

Stomach/Bowel: Stomach is within normal limits. Appendix appears
normal. No evidence of bowel wall thickening, distention, or
inflammatory changes.

Vascular/Lymphatic: No significant vascular findings are present. No
enlarged abdominal or pelvic lymph nodes.

Reproductive: Unremarkable.

Other: No abdominal wall hernia or abnormality. No abdominopelvic
ascites.

Musculoskeletal: No fracture or acute finding.  No bone lesion.
IMPRESSION: 1. No acute findings within the abdomen or pelvis. No findings to
account for the patient's left lower pelvis/scrotal region pain.
2. Two small low-attenuation lesions in the left kidney consistent
with cysts. No renal or ureteral stones.

## 2023-05-26 IMAGING — US US SCROTUM W/ DOPPLER COMPLETE
1 series · 14 of 25 positions shown · non-contrast
Comparison: None.

CLINICAL DATA: Left testis pain for 7 days

EXAM:
SCROTAL ULTRASOUND
DOPPLER ULTRASOUND OF THE TESTICLES
TECHNIQUE: Complete ultrasound examination of the testicles, epididymis, and
other scrotal structures was performed. Color and spectral Doppler
ultrasound were also utilized to evaluate blood flow to the
testicles.

[Series 1: us scrotum w/doppler · 14 of 65 slices shown]
[im 1/65]
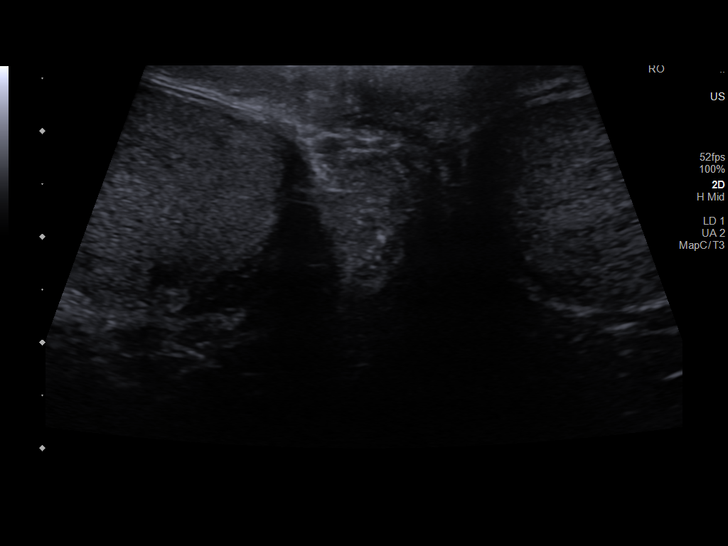
[im 6/65]
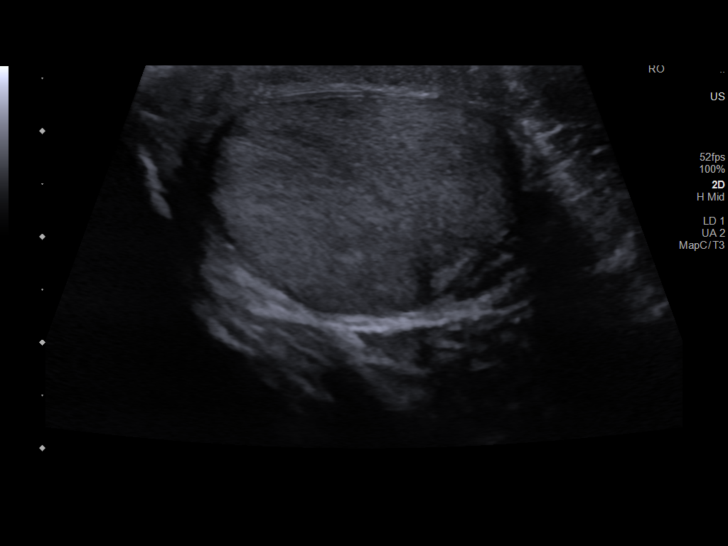
[im 11/65]
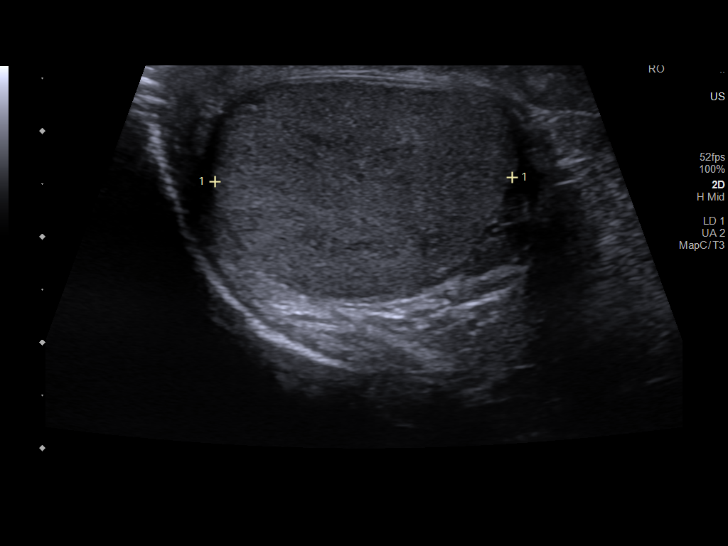
[im 17/65]
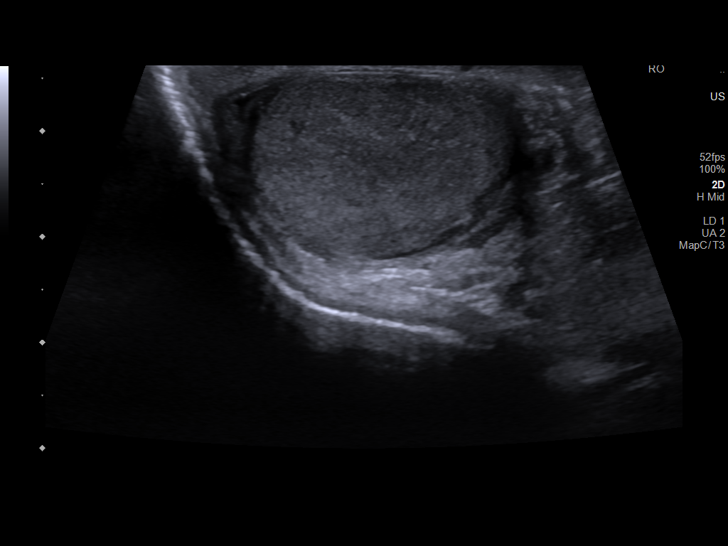
[im 22/65]
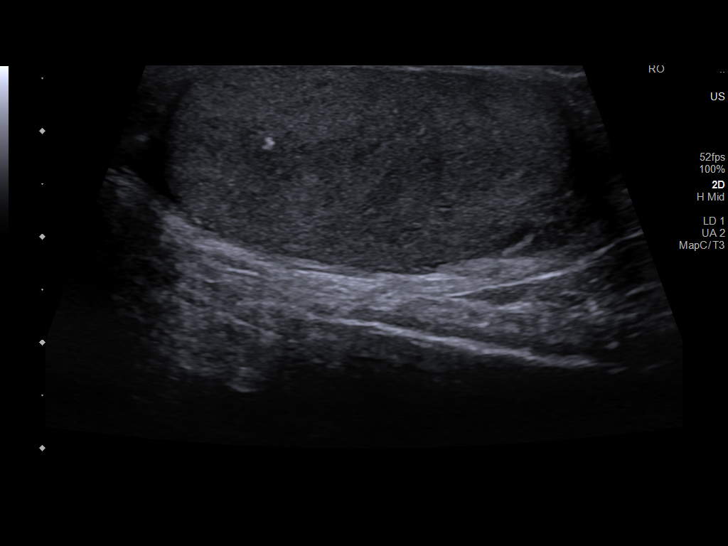
[im 25/65]
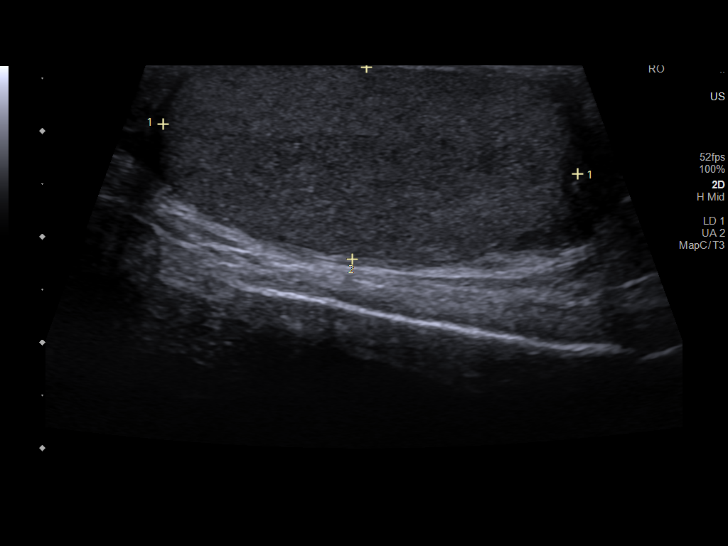
[im 30/65]
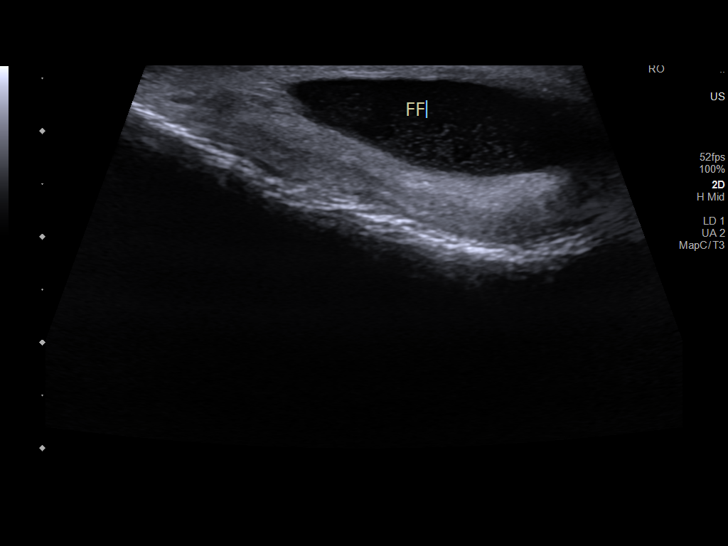
[im 35/65]
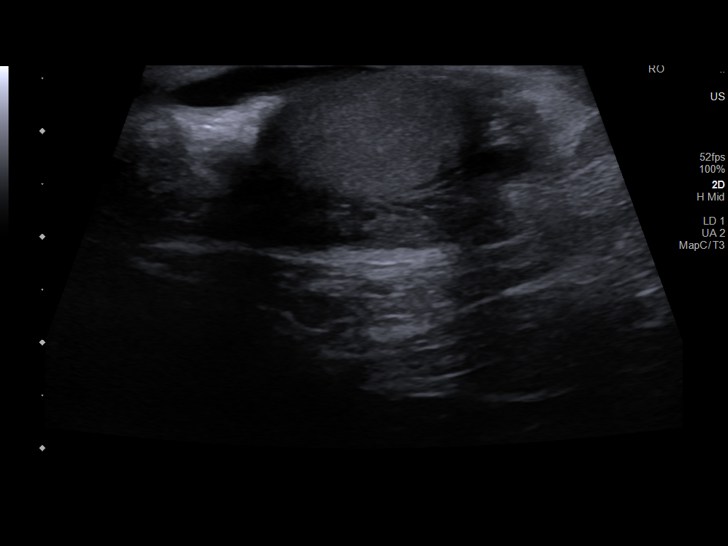
[im 41/65]
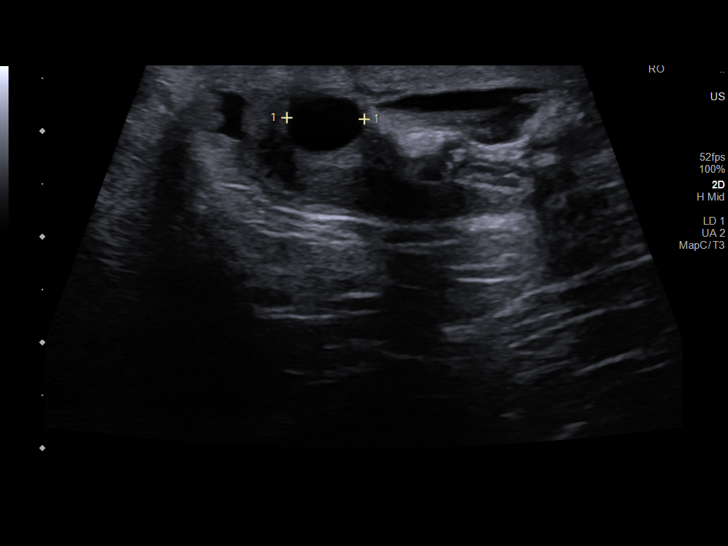
[im 43/65]
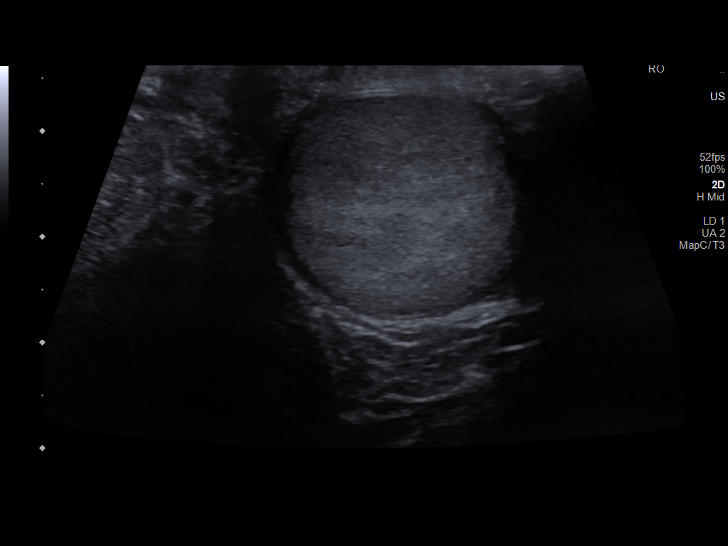
[im 49/65]
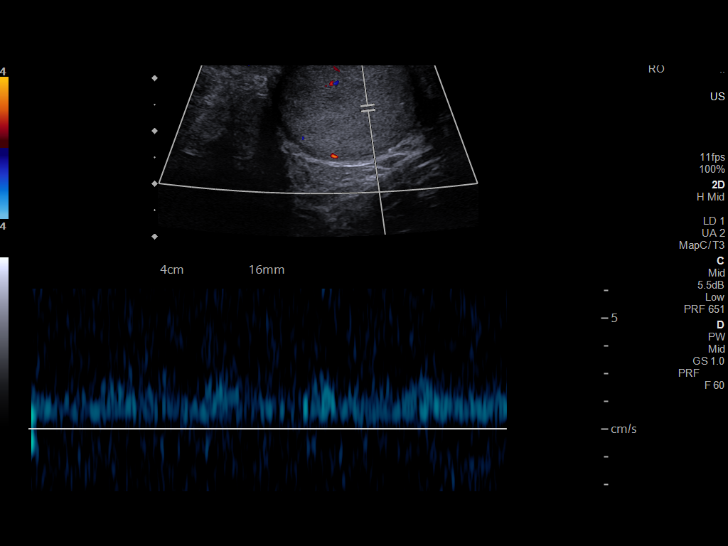
[im 54/65]
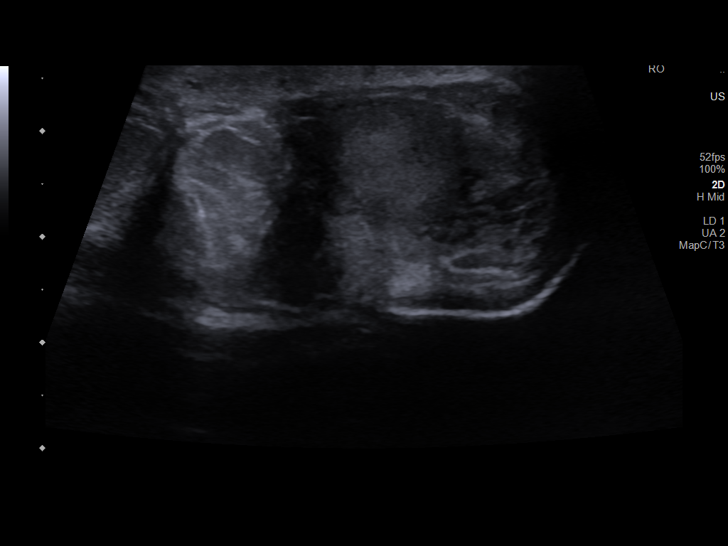
[im 59/65]
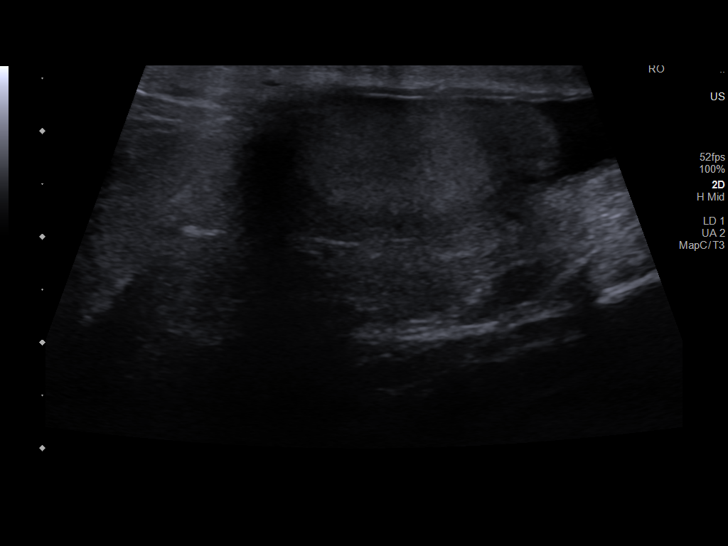
[im 65/65]
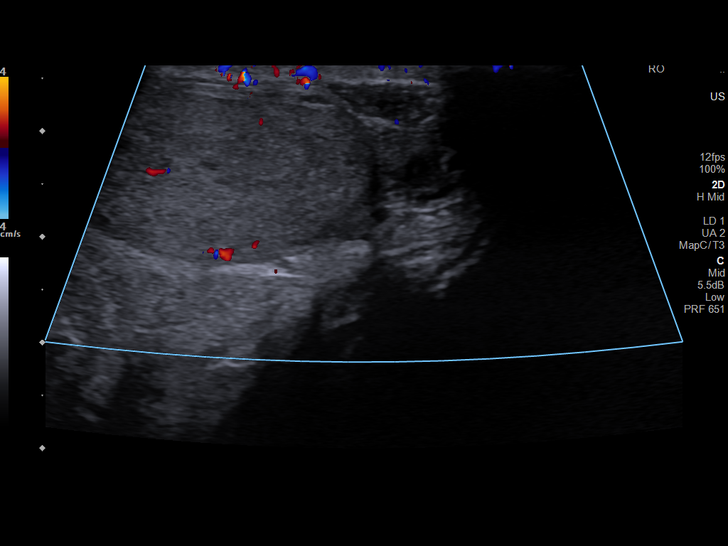

[14 of 25 positions shown; findings below may reference images not displayed]

FINDINGS: Right testicle

Measurements: 4.0 x 1.8 x 2.8 cm. 1 mm calcification identified. No
mass.

Left testicle

Measurements: 3.9 x 1.9 x 2.6 cm. No mass or microlithiasis
visualized.

Right epididymis:  7 x 6 x 7 mm cyst noted.

Left epididymis:  Normal in size and appearance.

Hydrocele:  Small right hydrocele.

Varicocele:  None visualized.

Pulsed Doppler interrogation of both testes demonstrates normal low
resistance arterial and venous waveforms bilaterally.
IMPRESSION: 1. No testicular mass or evidence for testicular torsion.
2. Right epididymal cyst.
3. Small right hydrocele.

## 2023-06-03 ENCOUNTER — Other Ambulatory Visit: Payer: Self-pay | Admitting: Internal Medicine

## 2023-06-03 DIAGNOSIS — I10 Essential (primary) hypertension: Secondary | ICD-10-CM

## 2023-08-18 ENCOUNTER — Ambulatory Visit (INDEPENDENT_AMBULATORY_CARE_PROVIDER_SITE_OTHER): Payer: Self-pay | Admitting: Orthopaedic Surgery

## 2023-08-18 ENCOUNTER — Encounter: Payer: Self-pay | Admitting: Orthopaedic Surgery

## 2023-08-18 ENCOUNTER — Other Ambulatory Visit (INDEPENDENT_AMBULATORY_CARE_PROVIDER_SITE_OTHER): Payer: Self-pay

## 2023-08-18 VITALS — BP 153/102 | HR 90 | Ht 73.0 in | Wt 237.0 lb

## 2023-08-18 DIAGNOSIS — M25571 Pain in right ankle and joints of right foot: Secondary | ICD-10-CM

## 2023-08-18 DIAGNOSIS — S96911A Strain of unspecified muscle and tendon at ankle and foot level, right foot, initial encounter: Secondary | ICD-10-CM

## 2023-08-18 NOTE — Patient Instructions (Signed)
Out of work

## 2023-08-18 NOTE — Progress Notes (Signed)
Subjective:    Patient ID: Andrew Howe, male    DOB: [birth year], 42 y.o.   MRN: [id number]  HPI He took a misstep Saturday 08-15-23 and hurt his right ankle.  It has not gotten much better. He twisted it in the fall.  He has more lateral pain.  He has no redness.  He has a walker and that helps.  He has no other injury.   Review of Systems  Constitutional:  Positive for activity change.  Musculoskeletal:  Positive for arthralgias, gait problem and joint swelling.  All other systems reviewed and are negative. For Review of Systems, all other systems reviewed and are negative.  The following is a summary of the past history medically, past history surgically, known current medicines, social history and family history.  This information is gathered electronically by the computer from prior information and documentation.  I review this each visit and have found including this information at this point in the chart is beneficial and informative.   Past Medical History:  Diagnosis Date   Anxiety    Depression    Depression    Phreesia 10/15/2020   Hypertension 12/02/2014   Phreesia 10/15/2020    No past surgical history on file.  Current Outpatient Medications on File Prior to Visit  Medication Sig Dispense Refill   lisinopril-hydrochlorothiazide (ZESTORETIC) 20-12.5 MG tablet Take 1 tablet by mouth once daily 90 tablet 0   metoprolol succinate (TOPROL-XL) 50 MG 24 hr tablet Take 1 tablet (50 mg total) by mouth daily. Take with or immediately following a meal. 90 tablet 1   No current facility-administered medications on file prior to visit.    Social History   Socioeconomic History   Marital status: Single    Spouse name: Not on file   Number of children: Not on file   Years of education: Not on file   Highest education level: Not on file  Occupational History    Employer: [employer]  Tobacco Use   Smoking status: Never   Smokeless tobacco: Never  Substance and Sexual  Activity   Alcohol use: No   Drug use: No   Sexual activity: Not on file  Other Topics Concern   Not on file  Social History Narrative   Not on file   Social Determinants of Health   Financial Resource Strain: Not on file  Food Insecurity: Not on file  Transportation Needs: Not on file  Physical Activity: Not on file  Stress: Not on file  Social Connections: Not on file  Intimate Partner Violence: Not on file    No family history on file.  BP (!) 153/102   Pulse 90   Ht 6\' 1"  (1.854 m)   Wt 237 lb (107.5 kg)   BMI 31.27 kg/m   Body mass index is 31.27 kg/m.      Objective:   Physical Exam Vitals and nursing note reviewed. Exam conducted with a chaperone present.  Constitutional:      Appearance: He is well-developed.  HENT:     Head: Normocephalic and atraumatic.  Eyes:     Conjunctiva/sclera: Conjunctivae normal.     Pupils: Pupils are equal, round, and reactive to light.  Cardiovascular:     Rate and Rhythm: Normal rate and regular rhythm.  Pulmonary:     Effort: Pulmonary effort is normal.  Abdominal:     Palpations: Abdomen is soft.  Musculoskeletal:     Cervical back: Normal range of motion and neck supple.  Feet:  Skin:    General: Skin is warm and dry.  Neurological:     Mental Status: He is alert and oriented to person, place, and time.     Cranial Nerves: No cranial nerve deficit.     Motor: No abnormal muscle tone.     Coordination: Coordination normal.     Deep Tendon Reflexes: Reflexes are normal and symmetric. Reflexes normal.  Psychiatric:        Behavior: Behavior normal.        Thought Content: Thought content normal.        Judgment: Judgment normal.   X-rays were done of the right ankle, reported separately.        Assessment & Plan:   Encounter Diagnoses  Name Primary?   Pain in right ankle and joints of right foot Yes   Strain of right ankle, initial encounter    I have given sheet of instructions for contrast  baths.  I have given CAM walker.  Continue the walker.  Advil or Aleve for pain.  Return in two weeks.  Stay out of work.  Call if any problem.  Precautions discussed.  Electronically Signed Darreld Mclean, MD 10/15/20248:53 AM

## 2023-09-01 ENCOUNTER — Encounter: Payer: Self-pay | Admitting: Orthopaedic Surgery

## 2023-09-01 ENCOUNTER — Ambulatory Visit (INDEPENDENT_AMBULATORY_CARE_PROVIDER_SITE_OTHER): Payer: Self-pay | Admitting: Orthopaedic Surgery

## 2023-09-01 VITALS — BP 177/93 | HR 80 | Ht 73.0 in | Wt 237.0 lb

## 2023-09-01 DIAGNOSIS — S96911D Strain of unspecified muscle and tendon at ankle and foot level, right foot, subsequent encounter: Secondary | ICD-10-CM

## 2023-09-01 DIAGNOSIS — M25571 Pain in right ankle and joints of right foot: Secondary | ICD-10-CM

## 2023-09-01 NOTE — Progress Notes (Signed)
I am a little better.  He has been using the CAM walker.  He has less swelling and less pain.  ROM is good.  He has pain over the anterior talofibular ligament of the right ankle.  Swelling is less.  NV intact.  Encounter Diagnoses  Name Primary?   Pain in right ankle and joints of right foot Yes   Strain of right ankle, subsequent encounter    Continue the CAM walker.  Return in two weeks.  Call if any problem.  Precautions discussed.  Electronically Signed Darreld Mclean, MD 10/29/20248:59 AM

## 2023-09-04 ENCOUNTER — Other Ambulatory Visit: Payer: Self-pay | Admitting: Internal Medicine

## 2023-09-04 DIAGNOSIS — I10 Essential (primary) hypertension: Secondary | ICD-10-CM

## 2023-09-16 ENCOUNTER — Ambulatory Visit: Payer: Self-pay | Admitting: Orthopaedic Surgery

## 2023-12-02 ENCOUNTER — Ambulatory Visit: Payer: Self-pay | Admitting: Orthopaedic Surgery

## 2023-12-08 ENCOUNTER — Other Ambulatory Visit: Payer: Self-pay | Admitting: Internal Medicine

## 2023-12-08 DIAGNOSIS — I10 Essential (primary) hypertension: Secondary | ICD-10-CM

## 2023-12-08 NOTE — Telephone Encounter (Signed)
 Copied from CRM [id number]. Topic: Clinical - Medication Refill >> Dec 08, 2023  8:39 AM Powell HERO wrote: Most Recent Primary Care Visit:  Provider: TOBIE SUZZANE POUR  Department: [facility]  Visit Type: OFFICE VISIT  Date: 07/24/2022  Medication: metoprolol  succinate (TOPROL -XL) 50 MG 24 hr tablet  Has the patient contacted their pharmacy? Yes To cal office  Is this the correct pharmacy for this prescription? Yes If no, delete pharmacy and type the correct one.  This is the patient's preferred pharmacy:  [facility] [address] [place] - [address] [place] [address] [place] [address] Phone: [phone] Fax: [phone]   Has the prescription been filled recently? Yes  Is the patient out of the medication? No, just getting it ready  Has the patient been seen for an appointment in the last year OR does the patient have an upcoming appointment? Yes  Can we respond through MyChart? No  Agent: Please be advised that Rx refills may take up to 3 business days. We ask that you follow-up with your pharmacy.

## 2023-12-08 NOTE — Telephone Encounter (Signed)
 Copied from CRM [id number]. Topic: Clinical - Medication Refill >> Dec 08, 2023  8:39 AM Geroge Baseman wrote: Most Recent Primary Care Visit:  Provider: Anabel Halon  Department: [facility]  Visit Type: OFFICE VISIT  Date: 07/24/2022  Medication: metoprolol succinate (TOPROL-XL) 50 MG 24 hr tablet  Has the patient contacted their pharmacy? Yes To cal office  Is this the correct pharmacy for this prescription? Yes If no, delete pharmacy and type the correct one.  This is the patient's preferred pharmacy:  [facility] [address] [place] - [address] [place] [address] [place] [address] Phone: [phone] Fax: [phone]   Has the prescription been filled recently? Yes  Is the patient out of the medication? No, just getting it ready  Has the patient been seen for an appointment in the last year OR does the patient have an upcoming appointment? Yes  Can we respond through MyChart? No  Agent: Please be advised that Rx refills may take up to 3 business days. We ask that you follow-up with your pharmacy.

## 2023-12-13 ENCOUNTER — Other Ambulatory Visit: Payer: Self-pay | Admitting: Internal Medicine

## 2023-12-13 DIAGNOSIS — I10 Essential (primary) hypertension: Secondary | ICD-10-CM

## 2023-12-14 ENCOUNTER — Telehealth: Payer: Self-pay | Admitting: Internal Medicine

## 2023-12-14 NOTE — Telephone Encounter (Signed)
 Copied from CRM [id number]. Topic: Clinical - Prescription Issue >> Dec 14, 2023  9:45 AM Bronson Canny wrote: Reason for CRM: metoprolol  succinate (TOPROL -XL) 50 MG 24 hr tablet [[id number]] Pharmacy stated the refill was not approved and to get a hold of the care team.

## 2023-12-15 ENCOUNTER — Other Ambulatory Visit: Payer: Self-pay | Admitting: Internal Medicine

## 2023-12-15 DIAGNOSIS — I10 Essential (primary) hypertension: Secondary | ICD-10-CM

## 2023-12-17 ENCOUNTER — Ambulatory Visit (INDEPENDENT_AMBULATORY_CARE_PROVIDER_SITE_OTHER): Payer: Self-pay | Admitting: Family Medicine

## 2023-12-17 VITALS — BP 160/96 | HR 108 | Ht 73.0 in | Wt 240.0 lb

## 2023-12-17 DIAGNOSIS — I10 Essential (primary) hypertension: Secondary | ICD-10-CM

## 2023-12-17 DIAGNOSIS — Z2821 Immunization not carried out because of patient refusal: Secondary | ICD-10-CM

## 2023-12-17 NOTE — Progress Notes (Signed)
   Andrew Howe     MRN: [id number]      DOB: 12/18/1980  Chief Complaint  Patient presents with   Follow-up    Follow up needs both meds refilled     HPI [name] is here for fclinic evaluation before he can have blood pressure medication refilled Reports normal BP at home and high in the office States he is still taking medications as prescribed Most recent visit in the office for blood pressure was in 07/2022, all recorder blood pressure readings in the office are high Labs ordered by PCPnever done, ordered since 2021 ROS Denies recent fever or chills. Denies sinus pressure, nasal congestion, ear pain or sore throat. Denies chest congestion, productive cough or wheezing. Denies chest pains, palpitations and leg swelling  PE  BP (!) 160/96   Pulse (!) 108   Ht 6\' 1"  (1.854 m)   Wt 240 lb 0.6 oz (108.9 kg)   SpO2 95%   BMI 31.67 kg/m   Patient alert and oriented and in no cardiopulmonary distress.  HEENT: No facial asymmetry, EOMI,     Neck supple .  Chest: Clear to auscultation bilaterally.  CVS: S1, S2 no murmurs, no S3.Regular rate.   Ext: No edema  MS: Adequate ROM spine, shoulders, hips and knees.  Skin: Intact, no ulcerations or rash noted.  Psych: Good eye contact, normal affect. Memory intact not anxious or depressed appearing.  CNS: CN 2-12 intact, power,  normal throughout.no focal deficits noted.   Assessment & Plan  Primary hypertension Elevated and uncontrolled DASH diet and commitment to daily physical activity for a minimum of 30 minutes discussed and encouraged, as a part of hypertension management. The importance of attaining a healthy weight is also discussed.     12/17/2023    4:49 PM 12/17/2023    4:24 PM 12/17/2023    4:21 PM 09/01/2023    8:45 AM 08/18/2023    8:29 AM 08/18/2023    8:16 AM 07/24/2022   10:56 AM  BP/Weight  Systolic BP 160 159 170 177 153 156 138  Diastolic BP 96 117 99 93 102 101 88  Wt. (Lbs)   240.04 237   237   BMI   31.67 kg/m2 31.27 kg/m2  31.27 kg/m2      F/u with PCP in 2 months  Refused influenza vaccine Offered and refused vaccine

## 2023-12-17 NOTE — Patient Instructions (Addendum)
F/u with PCP in 4 to 8 weeks, re evaluate blood pressure and review labs  I have ordered 2 months total of your current medications  Please bring your cuff to the viist with Dr patel  Fasting lipid, chem 7 and CBC to be drawn 3 to 7 days before appointment with Dr Allena Katz

## 2023-12-17 NOTE — Assessment & Plan Note (Signed)
Elevated and uncontrolled DASH diet and commitment to daily physical activity for a minimum of 30 minutes discussed and encouraged, as a part of hypertension management. The importance of attaining a healthy weight is also discussed.     12/17/2023    4:49 PM 12/17/2023    4:24 PM 12/17/2023    4:21 PM 09/01/2023    8:45 AM 08/18/2023    8:29 AM 08/18/2023    8:16 AM 07/24/2022   10:56 AM  BP/Weight  Systolic BP 160 159 170 177 153 156 138  Diastolic BP 96 117 99 93 102 101 88  Wt. (Lbs)   240.04 237  237   BMI   31.67 kg/m2 31.27 kg/m2  31.27 kg/m2      F/u with PCP in 2 months

## 2023-12-17 NOTE — Assessment & Plan Note (Signed)
Offered and refused vaccine

## 2023-12-19 ENCOUNTER — Encounter: Payer: Self-pay | Admitting: Internal Medicine

## 2023-12-21 ENCOUNTER — Telehealth: Payer: Self-pay | Admitting: Internal Medicine

## 2023-12-21 ENCOUNTER — Other Ambulatory Visit: Payer: Self-pay | Admitting: Internal Medicine

## 2023-12-21 DIAGNOSIS — I10 Essential (primary) hypertension: Secondary | ICD-10-CM

## 2023-12-21 MED ORDER — METOPROLOL SUCCINATE ER 50 MG PO TB24
50.0000 mg | ORAL_TABLET | Freq: Every day | ORAL | 1 refills | Status: DC
Start: 2023-12-21 — End: 2024-03-18

## 2023-12-21 MED ORDER — LISINOPRIL-HYDROCHLOROTHIAZIDE 20-12.5 MG PO TABS
1.0000 | ORAL_TABLET | Freq: Every day | ORAL | 0 refills | Status: DC
Start: 2023-12-21 — End: 2024-03-18

## 2023-12-21 NOTE — Telephone Encounter (Signed)
 Pt informed

## 2023-12-21 NOTE — Telephone Encounter (Signed)
Patient called after call hours left a message, was seen by provider 02.13.2025 was told will send in some blood pressure medicine, running high, nothing was called into his pharmacy. Needs medicine call into his pharmacy.

## 2024-02-15 ENCOUNTER — Ambulatory Visit: Payer: Self-pay | Admitting: Internal Medicine

## 2024-03-11 ENCOUNTER — Encounter (HOSPITAL_COMMUNITY): Payer: Self-pay

## 2024-03-15 LAB — LIPID PANEL
Chol/HDL Ratio: 5.3 ratio — ABNORMAL HIGH (ref 0.0–5.0)
Cholesterol, Total: 148 mg/dL (ref 100–199)
HDL: 28 mg/dL — ABNORMAL LOW (ref >39)
LDL Chol Calc (NIH): 47 mg/dL (ref 0–99)
Triglycerides: 498 mg/dL — ABNORMAL HIGH (ref 0–149)
VLDL Cholesterol Cal: 73 mg/dL — ABNORMAL HIGH (ref 5–40)

## 2024-03-15 LAB — CMP14+EGFR
ALT: 22 IU/L (ref 0–44)
AST: 16 IU/L (ref 0–40)
Albumin: 4.5 g/dL (ref 4.1–5.1)
Alkaline Phosphatase: 52 IU/L (ref 44–121)
BUN/Creatinine Ratio: 12 (ref 9–20)
BUN: 10 mg/dL (ref 6–24)
Bilirubin Total: 0.3 mg/dL (ref 0.0–1.2)
CO2: 20 mmol/L (ref 20–29)
Calcium: 9.1 mg/dL (ref 8.7–10.2)
Chloride: 103 mmol/L (ref 96–106)
Creatinine, Ser: 0.83 mg/dL (ref 0.76–1.27)
Globulin, Total: 2.3 g/dL (ref 1.5–4.5)
Glucose: 121 mg/dL — ABNORMAL HIGH (ref 70–99)
Potassium: 3.8 mmol/L (ref 3.5–5.2)
Sodium: 142 mmol/L (ref 134–144)
Total Protein: 6.8 g/dL (ref 6.0–8.5)
eGFR: 112 mL/min/{1.73_m2} (ref >59)

## 2024-03-15 LAB — CBC
Hematocrit: 46.4 % (ref 37.5–51.0)
Hemoglobin: 15.3 g/dL (ref 13.0–17.7)
MCH: 28.8 pg (ref 26.6–33.0)
MCHC: 33 g/dL (ref 31.5–35.7)
MCV: 87 fL (ref 79–97)
Platelets: 243 10*3/uL (ref 150–450)
RBC: 5.31 x10E6/uL (ref 4.14–5.80)
RDW: 13.6 % (ref 11.6–15.4)
WBC: 6.1 10*3/uL (ref 3.4–10.8)

## 2024-03-18 ENCOUNTER — Encounter: Payer: Self-pay | Admitting: Internal Medicine

## 2024-03-18 ENCOUNTER — Ambulatory Visit (INDEPENDENT_AMBULATORY_CARE_PROVIDER_SITE_OTHER): Payer: Self-pay | Admitting: Internal Medicine

## 2024-03-18 VITALS — BP 138/84 | HR 80 | Ht 73.0 in | Wt 242.2 lb

## 2024-03-18 DIAGNOSIS — G250 Essential tremor: Secondary | ICD-10-CM

## 2024-03-18 DIAGNOSIS — S93401D Sprain of unspecified ligament of right ankle, subsequent encounter: Secondary | ICD-10-CM

## 2024-03-18 DIAGNOSIS — I10 Essential (primary) hypertension: Secondary | ICD-10-CM

## 2024-03-18 DIAGNOSIS — E781 Pure hyperglyceridemia: Secondary | ICD-10-CM

## 2024-03-18 DIAGNOSIS — S93401A Sprain of unspecified ligament of right ankle, initial encounter: Secondary | ICD-10-CM | POA: Insufficient documentation

## 2024-03-18 DIAGNOSIS — F419 Anxiety disorder, unspecified: Secondary | ICD-10-CM

## 2024-03-18 MED ORDER — METOPROLOL SUCCINATE ER 50 MG PO TB24: 50.0000 mg | ORAL_TABLET | Freq: Every day | ORAL | 1 refills | Status: AC

## 2024-03-18 MED ORDER — LISINOPRIL-HYDROCHLOROTHIAZIDE 20-12.5 MG PO TABS: 1.0000 | ORAL_TABLET | Freq: Every day | ORAL | 3 refills | Status: AC

## 2024-03-18 NOTE — Assessment & Plan Note (Signed)
 Since 10/24 Has seen orthopedic surgeon Had x-ray of ankle, had soft tissue swelling, was negative for acute fracture Advised to apply ice Ibuprofen as needed for pain If persistent, needs to see orthopedic surgeon again

## 2024-03-18 NOTE — Assessment & Plan Note (Signed)
On B-blocker Advised to cut down caffeinated products 

## 2024-03-18 NOTE — Assessment & Plan Note (Addendum)
 BP Readings from Last 1 Encounters:  03/18/24 138/84   Well-controlled now Anxiety also a contributing factor, denies to take medication for anxiety On Metoprolol  50 mg QD and Lisinopril -hydrochlorothiazide  20-12.5 mg QD, checked BMP Counseled for compliance with the medications Advised DASH diet and moderate exercise/walking, at least 150 mins/week

## 2024-03-18 NOTE — Patient Instructions (Addendum)
Please continue to take medications as prescribed.  Please continue to follow low salt diet and perform moderate exercise/walking at least 150 mins/week. 

## 2024-03-18 NOTE — Progress Notes (Signed)
 Established Patient Office Visit  Subjective:  Patient ID: Andrew Howe, male    DOB: 07/10/1981  Age: 43 y.o. MRN: [id number]  CC:  Chief Complaint  Patient presents with   Medical Management of Chronic Issues    F/u for hypertension and lab results.     HPI Andrew Howe is a 43 y.o. male with past medical history of HTN, anxiety and essential tremors who presents for follow up of his chronic medical conditions.  He was last seen by me in 09/23.  HTN: States that his BP stays around 130s/80s at home and feels anxious in the office. His BP was elevated initially, but improved later. Denies any headache, dizziness, chest pain, dyspnea or palpitations.  Patient also has h/o essential tremors and seems that is the reason he was placed on B-blocker for his HTN.  He has right ankle pain from ankle twisting in 10/24, has seen Dr Iline Mallory for it.  Ankle pain is intermittent, dull, nonradiating.  He was given ankle boot, which he wore for 3 months. He has tried taking Ibuprofen with some relief.  Ankle swelling has resolved now.    Past Medical History:  Diagnosis Date   Anxiety    Depression    Depression    Phreesia 10/15/2020   Hypertension 12/02/2014   [employer] 10/15/2020    History reviewed. No pertinent surgical history.  History reviewed. No pertinent family history.  Social History   Socioeconomic History   Marital status: Single    Spouse name: Not on file   Number of children: Not on file   Years of education: Not on file   Highest education level: 12th grade  Occupational History    Employer: [employer]  Tobacco Use   Smoking status: Never   Smokeless tobacco: Never  Substance and Sexual Activity   Alcohol use: No   Drug use: No   Sexual activity: Not on file  Other Topics Concern   Not on file  Social History Narrative   Not on file   Social Drivers of Health   Financial Resource Strain: Low Risk  (12/17/2023)   Overall Financial Resource Strain  (CARDIA)    Difficulty of Paying Living Expenses: Not very hard  Food Insecurity: No Food Insecurity (12/17/2023)   Hunger Vital Sign    Worried About Running Out of Food in the Last Year: Never true    Ran Out of Food in the Last Year: Never true  Transportation Needs: No Transportation Needs (12/17/2023)   PRAPARE - Administrator, Civil Service (Medical): No    Lack of Transportation (Non-Medical): No  Physical Activity: Sufficiently Active (12/17/2023)   Exercise Vital Sign    Days of Exercise per Week: 6 days    Minutes of Exercise per Session: 60 min  Stress: Stress Concern Present (12/17/2023)   [employer] of Occupational Health - Occupational Stress Questionnaire    Feeling of Stress : To some extent  Social Connections: Unknown (12/17/2023)   Social Connection and Isolation Panel [NHANES]    Frequency of Communication with Friends and Family: Twice a week    Frequency of Social Gatherings with Friends and Family: Once a week    Attends Religious Services: Patient declined    Database administrator or Organizations: No    Attends Engineer, structural: Not on file    Marital Status: Never married  Intimate Partner Violence: Not on file    Outpatient Medications Prior to Visit  Medication Sig Dispense Refill   lisinopril -hydrochlorothiazide  (ZESTORETIC ) 20-12.5 MG tablet Take 1 tablet by mouth daily. 90 tablet 0   metoprolol  succinate (TOPROL -XL) 50 MG 24 hr tablet Take 1 tablet (50 mg total) by mouth daily. Take with or immediately following a meal. 90 tablet 1   No facility-administered medications prior to visit.    No Active Allergies  ROS Review of Systems  Constitutional:  Negative for chills and fever.  HENT:  Negative for congestion and sore throat.   Eyes:  Negative for pain and discharge.  Respiratory:  Negative for cough and shortness of breath.   Cardiovascular:  Negative for chest pain and palpitations.  Gastrointestinal:  Negative  for constipation, diarrhea, nausea and vomiting.  Endocrine: Negative for polydipsia and polyuria.  Genitourinary:  Negative for dysuria and hematuria.  Musculoskeletal:  Negative for neck pain and neck stiffness.  Skin:  Negative for rash.  Neurological:  Negative for dizziness, weakness, numbness and headaches.  Psychiatric/Behavioral:  Negative for agitation, behavioral problems and sleep disturbance. The patient is nervous/anxious.       Objective:    Physical Exam Vitals reviewed.  Constitutional:      General: He is not in acute distress.    Appearance: He is not diaphoretic.  HENT:     Head: Normocephalic and atraumatic.     Nose: Nose normal.     Mouth/Throat:     Mouth: Mucous membranes are moist.  Eyes:     General: No scleral icterus.    Extraocular Movements: Extraocular movements intact.  Cardiovascular:     Rate and Rhythm: Regular rhythm. Tachycardia present.     Heart sounds: Normal heart sounds. No murmur heard. Pulmonary:     Breath sounds: Normal breath sounds. No wheezing or rales.  Musculoskeletal:     Cervical back: Neck supple. No tenderness.     Right lower leg: No edema.     Left lower leg: No edema.     Right ankle: Swelling (Around lateral malleolus) present. Tenderness present over the lateral malleolus. Normal range of motion.  Skin:    General: Skin is warm.     Findings: No rash.  Neurological:     General: No focal deficit present.     Mental Status: He is alert and oriented to person, place, and time.  Psychiatric:        Mood and Affect: Mood normal.        Behavior: Behavior normal.     BP 138/84 (BP Location: Left Arm)   Pulse 80   Ht 6\' 1"  (1.854 m)   Wt 242 lb 3.2 oz (109.9 kg)   SpO2 95%   BMI 31.95 kg/m  Wt Readings from Last 3 Encounters:  03/18/24 242 lb 3.2 oz (109.9 kg)  12/17/23 240 lb 0.6 oz (108.9 kg)  09/01/23 237 lb (107.5 kg)    No results found for: "TSH" Lab Results  Component Value Date   WBC 6.1  03/14/2024   HGB 15.3 03/14/2024   HCT 46.4 03/14/2024   MCV 87 03/14/2024   PLT 243 03/14/2024   Lab Results  Component Value Date   NA 142 03/14/2024   K 3.8 03/14/2024   CO2 20 03/14/2024   GLUCOSE 121 (H) 03/14/2024   BUN 10 03/14/2024   CREATININE 0.83 03/14/2024   BILITOT 0.3 03/14/2024   ALKPHOS 52 03/14/2024   AST 16 03/14/2024   ALT 22 03/14/2024   PROT 6.8 03/14/2024   ALBUMIN 4.5 03/14/2024  CALCIUM 9.1 03/14/2024   ANIONGAP 8 08/07/2021   EGFR 112 03/14/2024   Lab Results  Component Value Date   CHOL 148 03/14/2024   Lab Results  Component Value Date   HDL 28 (L) 03/14/2024   Lab Results  Component Value Date   LDLCALC 47 03/14/2024   Lab Results  Component Value Date   TRIG 498 (H) 03/14/2024   Lab Results  Component Value Date   CHOLHDL 5.3 (H) 03/14/2024   No results found for: "HGBA1C"    Assessment & Plan:   Problem List Items Addressed This Visit       Cardiovascular and Mediastinum   Primary hypertension - Primary   BP Readings from Last 1 Encounters:  03/18/24 138/84   Well-controlled now Anxiety also a contributing factor, denies to take medication for anxiety On Metoprolol  50 mg QD and Lisinopril -hydrochlorothiazide  20-12.5 mg QD, checked BMP Counseled for compliance with the medications Advised DASH diet and moderate exercise/walking, at least 150 mins/week      Relevant Medications   lisinopril -hydrochlorothiazide  (ZESTORETIC ) 20-12.5 MG tablet   metoprolol  succinate (TOPROL -XL) 50 MG 24 hr tablet     Nervous and Auditory   Essential tremor   On B-blocker Advised to cut down caffeinated products        Musculoskeletal and Integument   Sprain of right ankle   Since 10/24 Has seen orthopedic surgeon Had x-ray of ankle, had soft tissue swelling, was negative for acute fracture Advised to apply ice Ibuprofen as needed for pain If persistent, needs to see orthopedic surgeon again        Other   Anxiety    Reports h/o anxiety Likely one of the contributing factor for HTN Denies to take any medications for it      Pure hypertriglyceridemia   Last lipid profile reviewed - had non-fasting blood tests Advised to follow low-cholesterol diet      Relevant Medications   lisinopril -hydrochlorothiazide  (ZESTORETIC ) 20-12.5 MG tablet   metoprolol  succinate (TOPROL -XL) 50 MG 24 hr tablet    Meds ordered this encounter  Medications   lisinopril -hydrochlorothiazide  (ZESTORETIC ) 20-12.5 MG tablet    Sig: Take 1 tablet by mouth daily.    Dispense:  90 tablet    Refill:  3   metoprolol  succinate (TOPROL -XL) 50 MG 24 hr tablet    Sig: Take 1 tablet (50 mg total) by mouth daily. Take with or immediately following a meal.    Dispense:  90 tablet    Refill:  1    Follow-up: Return in about 6 months (around 09/18/2024) for HTN.    Meldon Sport, MD

## 2024-03-18 NOTE — Assessment & Plan Note (Signed)
Reports h/o anxiety Likely one of the contributing factor for HTN Denies to take any medications for it

## 2024-03-18 NOTE — Assessment & Plan Note (Addendum)
 Last lipid profile reviewed - had non-fasting blood tests Advised to follow low-cholesterol diet

## 2024-06-24 ENCOUNTER — Encounter: Payer: Self-pay | Admitting: Radiology

## 2024-09-05 ENCOUNTER — Encounter: Payer: Self-pay | Admitting: Radiology

## 2024-09-19 ENCOUNTER — Ambulatory Visit: Payer: Self-pay | Admitting: Internal Medicine

## 2024-09-23 ENCOUNTER — Ambulatory Visit: Payer: Self-pay | Admitting: Internal Medicine
